# Patient Record
Sex: Female | Born: 1945 | Race: Black or African American | Hispanic: No | Marital: Married | Smoking: Current every day smoker
Health system: Southern US, Community
[De-identification: ages and names within clinical notes are randomized; demographics above are authoritative.]

## PROBLEM LIST (undated history)

## (undated) DIAGNOSIS — I10 Essential (primary) hypertension: Secondary | ICD-10-CM

## (undated) DIAGNOSIS — K219 Gastro-esophageal reflux disease without esophagitis: Secondary | ICD-10-CM

## (undated) DIAGNOSIS — J45909 Unspecified asthma, uncomplicated: Secondary | ICD-10-CM

## (undated) DIAGNOSIS — E7800 Pure hypercholesterolemia, unspecified: Secondary | ICD-10-CM

## (undated) DIAGNOSIS — E876 Hypokalemia: Secondary | ICD-10-CM

## (undated) DIAGNOSIS — M1990 Unspecified osteoarthritis, unspecified site: Secondary | ICD-10-CM

## (undated) HISTORY — PX: OTHER SURGICAL HISTORY: SHX169

## (undated) HISTORY — PX: NO PAST SURGERIES: SHX2092

---
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
## 2017-12-30 ENCOUNTER — Emergency Department (HOSPITAL_COMMUNITY)
Admission: EM | Admit: 2017-12-30 | Discharge: 2017-12-30 | Disposition: A | Discharge status: Home / Self Care | Payer: Medicare Other | Attending: Emergency Medicine | Admitting: Emergency Medicine

## 2017-12-30 ENCOUNTER — Other Ambulatory Visit: Payer: Self-pay

## 2017-12-30 ENCOUNTER — Encounter (HOSPITAL_COMMUNITY): Payer: Self-pay | Admitting: Emergency Medicine

## 2017-12-30 DIAGNOSIS — R030 Elevated blood-pressure reading, without diagnosis of hypertension: Secondary | ICD-10-CM | POA: Insufficient documentation

## 2017-12-30 DIAGNOSIS — I159 Secondary hypertension, unspecified: Secondary | ICD-10-CM

## 2017-12-30 MED ORDER — ATORVASTATIN CALCIUM 10 MG PO TABS
10.0000 mg | ORAL_TABLET | Freq: Every day | ORAL | Status: DC
  Filled 2017-12-30: qty 1

## 2017-12-30 MED ORDER — LISINOPRIL 10 MG PO TABS
10.0000 mg | ORAL_TABLET | Freq: Once | ORAL | Status: AC
  Administered 2017-12-30: 10 mg via ORAL
  Filled 2017-12-30: qty 1

## 2017-12-30 MED ORDER — ATORVASTATIN CALCIUM 10 MG PO TABS: 10.0000 mg | ORAL_TABLET | Freq: Every day | ORAL | 0 refills | Status: DC

## 2017-12-30 MED ORDER — LISINOPRIL 10 MG PO TABS: 10.0000 mg | ORAL_TABLET | Freq: Every day | ORAL | 0 refills | Status: DC

## 2017-12-30 NOTE — Progress Notes (Signed)
CSW consulted by Chaplain. CSW was informed that pt's husband is in need of food and money at this time. CSW informed husband and chaplain that CSW is unable to provide money to pt's husband for anything at this time. CSW provided pt's husband with food voucher to get dinner as CSW was made aware that pt may be here for a little while. At this time there are no further CSW needs. CSW signing off.    Claude MangesKierra S. Djuana Littleton, MSW, LCSW-A Emergency Department Clinical Social Worker [phone]

## 2017-12-30 NOTE — ED Provider Notes (Signed)
[facility] Provider Note   CSN: [id number] Arrival date & time: 12/30/17  1158     History   Chief Complaint Chief Complaint  Patient presents with  . Hypertension    HPI Stacey Harper is a 72 y.o. female.   Hypertension  This is a chronic problem. The current episode started more than 1 week ago. The problem occurs constantly. The problem has not changed since onset.Pertinent negatives include no chest pain, no abdominal pain, no headaches and no shortness of breath. Nothing aggravates the symptoms. Nothing relieves the symptoms. She has tried nothing (supposed to be on lisinopril) for the symptoms.    No past medical history on file.  There are no active problems to display for this patient.   Home Medications    Prior to Admission medications   Medication Sig Start Date End Date Taking? Authorizing Provider  atorvastatin (LIPITOR) 10 MG tablet Take 1 tablet (10 mg total) by mouth daily. 12/30/17   Monica Zahler, Barbara CowerJason, MD  lisinopril (PRINIVIL,ZESTRIL) 10 MG tablet Take 1 tablet (10 mg total) by mouth daily. 12/30/17   Dacota Ruben, Barbara CowerJason, MD    Family History No family history on file.  Social History Social History   Tobacco Use  . Smoking status: Not on file  Substance Use Topics  . Alcohol use: Not on file  . Drug use: Not on file     Allergies   Patient has no allergy information on record.   Review of Systems Review of Systems  Constitutional:       Generalized 'weird feeling'  Respiratory: Negative for shortness of breath.   Cardiovascular: Negative for chest pain.  Gastrointestinal: Negative for abdominal pain.  Neurological: Negative for headaches.  All other systems reviewed and are negative.    Physical Exam Updated Vital Signs BP (!) 178/76   Pulse (!) 43   Temp 98.7 F (37.1 C) (Oral)   Resp 14   Ht 5\' 6"  (1.676 m)   Wt 83.9 kg (185 lb)   SpO2 100%   BMI 29.86 kg/m   Physical Exam  Constitutional:  She is oriented to person, place, and time. She appears well-developed and well-nourished.  HENT:  Head: Normocephalic and atraumatic.  Eyes: Conjunctivae and EOM are normal.  No papiledema or retinal hemorrhage  Neck: Normal range of motion.  Cardiovascular: Normal rate and regular rhythm.  Pulmonary/Chest: No stridor. No respiratory distress.  Abdominal: Soft. She exhibits no distension.  Neurological: She is alert and oriented to person, place, and time. No cranial nerve deficit. Coordination normal.  Skin: Skin is warm and dry. No erythema. No pallor.  Nursing note and vitals reviewed.    ED Treatments / Results  Labs (all labs ordered are listed, but only abnormal results are displayed) Labs Reviewed - No data to display  EKG  EKG Interpretation None       Radiology No results found.  Procedures Procedures (including critical care time)  Medications Ordered in ED Medications  atorvastatin (LIPITOR) tablet 10 mg (not administered)  lisinopril (PRINIVIL,ZESTRIL) tablet 10 mg (10 mg Oral Given 12/30/17 1352)     Initial Impression / Assessment and Plan / ED Course  I have reviewed the triage vital signs and the nursing notes.  Pertinent labs & imaging results that were available during my care of the patient were reviewed by me and considered in my medical decision making (see chart for details).     This is a 72 yo M  who presents with asymptomatic hypertension. They have had blood pressures as high as 220. Not had any headache, chest pain, shortness of breath, abdominal pain, back pain lower extremity edema or changes in urinary frequency beyond normal. Has not been taking blood pressure medications as directed by the primary doctor. Exam does not show any evidence of neurologic changes, fluid overload, vision changes or other acute issues secondary to hypertension. As ACEP guidelines recommend not acutely lowering BP and having close PCP follow-up for asymptomatic  hypertension I suggested that they keep a log of their blood pressures for the next 5-7 days and follow-up with her primary doctor for any medication changes after restarting Lisinopril. They know to return to the emergency department for any onset of the symptoms as listed above.   Final Clinical Impressions(s) / ED Diagnoses   Final diagnoses:  Secondary hypertension    ED Discharge Orders        Ordered    atorvastatin (LIPITOR) 10 MG tablet  Daily     12/30/17 1425    lisinopril (PRINIVIL,ZESTRIL) 10 MG tablet  Daily     12/30/17 1425       Zayaan Kozak, Barbara Cower, MD 12/30/17 1604

## 2017-12-30 NOTE — Discharge Planning (Signed)
Lamees Gable J. Lucretia RoersWood, RN, BSN, [place] [phone]  [facility] Messingoger [facility] @10 :30.  Spoke with pt at bedside and advised to please arrive 15 min early and take a picture ID and your current medications.  Pt verbalizes understanding of keeping appointment.

## 2017-12-30 NOTE — ED Triage Notes (Signed)
Patient states she has been having headaches x several days and dizziness as well. 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

## 2017-12-30 NOTE — ED Notes (Signed)
When rounding on patient a gentleman in the room stating he was the patients husband abruptly stated he would like to talk with the chaplain. Chaplain called

## 2017-12-30 NOTE — ED Notes (Signed)
Chaplain paged for family per their request

## 2018-01-13 ENCOUNTER — Inpatient Hospital Stay (INDEPENDENT_AMBULATORY_CARE_PROVIDER_SITE_OTHER): Payer: Self-pay | Admitting: Physician Assistant

## 2018-01-24 ENCOUNTER — Ambulatory Visit (INDEPENDENT_AMBULATORY_CARE_PROVIDER_SITE_OTHER): Payer: Medicare Other | Admitting: Physician Assistant

## 2018-01-24 ENCOUNTER — Ambulatory Visit (HOSPITAL_COMMUNITY)
Admission: RE | Admit: 2018-01-24 | Discharge: 2018-01-24 | Disposition: A | Discharge status: Home / Self Care | Payer: MEDICARE | Source: Ambulatory Visit | Attending: Physician Assistant | Admitting: Physician Assistant

## 2018-01-24 ENCOUNTER — Encounter (INDEPENDENT_AMBULATORY_CARE_PROVIDER_SITE_OTHER): Payer: Self-pay | Admitting: Physician Assistant

## 2018-01-24 VITALS — BP 206/76 | HR 49 | Temp 97.3°F | Resp 18 | Ht 63.0 in | Wt 182.0 lb

## 2018-01-24 DIAGNOSIS — R0989 Other specified symptoms and signs involving the circulatory and respiratory systems: Secondary | ICD-10-CM

## 2018-01-24 DIAGNOSIS — R001 Bradycardia, unspecified: Secondary | ICD-10-CM | POA: Diagnosis not present

## 2018-01-24 DIAGNOSIS — I10 Essential (primary) hypertension: Secondary | ICD-10-CM | POA: Diagnosis not present

## 2018-01-24 LAB — EKG 12-LEAD

## 2018-01-24 MED ORDER — CHLORTHALIDONE 25 MG PO TABS: 25.0000 mg | ORAL_TABLET | Freq: Every day | ORAL | 5 refills | Status: DC

## 2018-01-24 MED ORDER — AMLODIPINE BESYLATE 5 MG PO TABS: 5.0000 mg | ORAL_TABLET | Freq: Every day | ORAL | 5 refills | Status: DC

## 2018-01-24 MED ORDER — LISINOPRIL 20 MG PO TABS: 20.0000 mg | ORAL_TABLET | Freq: Every day | ORAL | 5 refills | Status: DC

## 2018-01-24 NOTE — Progress Notes (Addendum)
Subjective:  Patient ID: Stacey SalisburyReta Harper, female    DOB: [birth year]  Age: 72 y.o. MRN: [id number]  CC: HTN   HPI Stacey Harper is a 72 y.o. female with a medical history of HTN and tobacco use presents as a new patient for managament of her HTN. Went to ED on 12/30/17 for asymptomatic HTN and diagnosed with secondary HTN. BP 178/76 mmHg HR 43. Pt reported systolic BP usually in the 220s. No labs, imaging, ECG, or consult made at the ED. Patient maintains that she is asymptomatic. Takes lisinopril 10 mg daily as directed. BP still noted to be elevated in clinic today. Does not endorse CP, palpitations, SOB, HA, PND, orthopnea, presyncope, syncope, claudication, abdominal pain, f/c/n/v, rash, GI/GU sxs.        Outpatient Medications Prior to Visit  Medication Sig Dispense Refill  . atorvastatin (LIPITOR) 10 MG tablet Take 1 tablet (10 mg total) by mouth daily. 30 tablet 0  . lisinopril (PRINIVIL,ZESTRIL) 10 MG tablet Take 1 tablet (10 mg total) by mouth daily. 30 tablet 0   No facility-administered medications prior to visit.      ROS Review of Systems  Constitutional: Negative for chills, fever and malaise/fatigue.  Eyes: Negative for blurred vision.  Respiratory: Negative for shortness of breath.   Cardiovascular: Negative for chest pain and palpitations.  Gastrointestinal: Negative for abdominal pain and nausea.  Genitourinary: Negative for dysuria and hematuria.  Musculoskeletal: Negative for joint pain and myalgias.  Skin: Negative for rash.  Neurological: Negative for tingling and headaches.  Psychiatric/Behavioral: Negative for depression. The patient is not nervous/anxious.     Objective:   Vitals:   01/24/18 1023 01/24/18 1053  BP: (!) 204/67 (!) 206/76  Pulse: (!) 49   Resp: 18   Temp: (!) 97.3 F (36.3 C)   SpO2: 100%       Physical Exam  Constitutional: She is oriented to person, place, and time.  Well developed, well nourished, NAD, polite  HENT:  Head:  Normocephalic and atraumatic.  Eyes: No scleral icterus.  Neck: Normal range of motion. Neck supple. No thyromegaly present.  Cardiovascular: Normal rate, regular rhythm and normal heart sounds.  No carotid bruits.  Pulmonary/Chest: Effort normal and breath sounds normal.  Abdominal: Soft. Bowel sounds are normal. There is no tenderness.  Musculoskeletal: She exhibits no edema.  Lymphadenopathy:    She has no cervical adenopathy.  Neurological: She is alert and oriented to person, place, and time. No cranial nerve deficit. Coordination normal.  Skin: Skin is warm and dry. No rash noted. No erythema. No pallor.  Psychiatric: She has a normal mood and affect. Her behavior is normal. Thought content normal.  Vitals reviewed.    Assessment & Plan:    1. Hypertension, isolated systolic - EKG 12-Lead, sent to [facility] for EKG. Precordial leads not recording on our EKG.  - DG Chest 2 View; Future - Comprehensive metabolic panel - CBC with Differential - TSH - Brain natriuretic peptide - Lipid panel - Begin Chlorthalidone 25 mg one tablet po qam x30 days #30 five refills - Increase lisinopril (PRINIVIL,ZESTRIL) 20 MG tablet; Take 1 tablet (20 mg total) by mouth daily.  Dispense: 30 tablet; Refill: 5 - Advised patient to return as a walk in next Friday to have BP checked.  - Ambulatory referral to Cardiology  2. Widened pulse pressure  3. Bradycardia - possible thyroid etiology? Temperature is also somewhat low that may further support thyroid disease. Will wait for result of  TSH.  - TSH   Follow-up: 1 week for BP check  Loletta Specter PA

## 2018-01-24 NOTE — Addendum Note (Signed)
Addended by: Sindy MessingGOMEZ, ROGER D on: 01/24/2018 03:32 PM   Modules accepted: Orders

## 2018-01-25 LAB — SPECIMEN STATUS REPORT

## 2018-01-27 ENCOUNTER — Other Ambulatory Visit (INDEPENDENT_AMBULATORY_CARE_PROVIDER_SITE_OTHER): Payer: Self-pay | Admitting: *Deleted

## 2018-01-27 ENCOUNTER — Telehealth (INDEPENDENT_AMBULATORY_CARE_PROVIDER_SITE_OTHER): Payer: Self-pay | Admitting: *Deleted

## 2018-01-27 ENCOUNTER — Other Ambulatory Visit (INDEPENDENT_AMBULATORY_CARE_PROVIDER_SITE_OTHER): Payer: Self-pay | Admitting: Physician Assistant

## 2018-01-27 DIAGNOSIS — I10 Essential (primary) hypertension: Secondary | ICD-10-CM

## 2018-01-27 DIAGNOSIS — E7800 Pure hypercholesterolemia, unspecified: Secondary | ICD-10-CM

## 2018-01-27 MED ORDER — LISINOPRIL 20 MG PO TABS: 20.0000 mg | ORAL_TABLET | Freq: Every day | ORAL | 5 refills | Status: DC

## 2018-01-27 MED ORDER — ATORVASTATIN CALCIUM 20 MG PO TABS: 20.0000 mg | ORAL_TABLET | Freq: Every day | ORAL | 3 refills | Status: DC

## 2018-01-27 MED ORDER — CHLORTHALIDONE 25 MG PO TABS: 25.0000 mg | ORAL_TABLET | Freq: Every day | ORAL | 5 refills | Status: DC

## 2018-01-27 NOTE — Telephone Encounter (Signed)
Medical Assistant left message on patient's home and cell voicemail. Voicemail states to give a call back [place] [facility] at [phone]. Please inform patient of chest x ray being normal and cholesterol being a bit elevated so an increase was sent to the pharmacy. Please ask patient to return to clinic for ONLY a blood draw. Whenever she is able to drop in ASAP.

## 2018-01-27 NOTE — Telephone Encounter (Signed)
Patient verified DOB Patient is aware of chest xray being normal and atorvastatin being increased due to elevated cholesterol levels. Patient will report back today to have blood drawn for CBC and BNP. Patient requested medication be sent to [facility] on [address] so MA reordered medications there.

## 2018-01-27 NOTE — Telephone Encounter (Signed)
-----   Message from Loletta Specteroger David Gomez, PA-C sent at 01/27/2018  8:46 AM EST ----- CBC and BNP not performed needs redo. Please separate and freeze the BNP. Redraw CBC on a separate lavender.  Thyroid normal, cholesterol is mildly elevated I will increase her atorvastatin from 10 mg to 20 mg qhs. [facility]

## 2018-01-30 LAB — COMPREHENSIVE METABOLIC PANEL
A/G RATIO: 1.1 — AB (ref 1.2–2.2)
ALT: 14 IU/L (ref 0–32)
AST: 15 IU/L (ref 0–40)
Albumin: 4 g/dL (ref 3.6–4.8)
Alkaline Phosphatase: 97 IU/L (ref 39–117)
BUN / CREAT RATIO: 11 — AB (ref 12–28)
BUN: 11 mg/dL (ref 8–27)
Bilirubin Total: 0.5 mg/dL (ref 0.0–1.2)
CALCIUM: 9.4 mg/dL (ref 8.7–10.3)
CO2: 24 mmol/L (ref 20–29)
Chloride: 102 mmol/L (ref 96–106)
Creatinine, Ser: 0.99 mg/dL (ref 0.57–1.00)
GFR, EST AFRICAN AMERICAN: 67 mL/min/{1.73_m2} (ref >59)
GFR, EST NON AFRICAN AMERICAN: 58 mL/min/{1.73_m2} — AB (ref >59)
GLOBULIN, TOTAL: 3.5 g/dL (ref 1.5–4.5)
Glucose: 83 mg/dL (ref 65–99)
Potassium: 3.8 mmol/L (ref 3.5–5.2)
SODIUM: 141 mmol/L (ref 134–144)
Total Protein: 7.5 g/dL (ref 6.0–8.5)

## 2018-01-30 LAB — LIPID PANEL
CHOL/HDL RATIO: 4 ratio (ref 0.0–4.4)
Cholesterol, Total: 171 mg/dL (ref 100–199)
HDL: 43 mg/dL (ref >39)
LDL Calculated: 110 mg/dL — ABNORMAL HIGH (ref 0–99)
Triglycerides: 91 mg/dL (ref 0–149)
VLDL CHOLESTEROL CAL: 18 mg/dL (ref 5–40)

## 2018-01-30 LAB — CBC WITH DIFFERENTIAL/PLATELET

## 2018-01-30 LAB — SPECIMEN STATUS REPORT

## 2018-01-30 LAB — BRAIN NATRIURETIC PEPTIDE: BNP: 139.2 pg/mL — AB (ref 0.0–100.0)

## 2018-01-30 LAB — TSH: TSH: 2.1 u[IU]/mL (ref 0.450–4.500)

## 2018-02-03 ENCOUNTER — Ambulatory Visit: Payer: Self-pay | Admitting: Cardiology

## 2018-02-05 ENCOUNTER — Encounter: Payer: Self-pay | Admitting: Cardiology

## 2018-02-07 ENCOUNTER — Telehealth (INDEPENDENT_AMBULATORY_CARE_PROVIDER_SITE_OTHER): Payer: Self-pay | Admitting: *Deleted

## 2018-02-07 NOTE — Telephone Encounter (Signed)
-----   Message from Loletta Specteroger David Gomez, PA-C sent at 02/05/2018 12:51 PM EST ----- Result not at the level of CHF.

## 2018-02-07 NOTE — Telephone Encounter (Signed)
Patient verified DOB Patient is aware of BNP level not being at CHF.

## 2018-03-06 ENCOUNTER — Ambulatory Visit (INDEPENDENT_AMBULATORY_CARE_PROVIDER_SITE_OTHER): Payer: Self-pay | Admitting: Physician Assistant

## 2018-03-17 ENCOUNTER — Telehealth (INDEPENDENT_AMBULATORY_CARE_PROVIDER_SITE_OTHER): Payer: Self-pay | Admitting: Physician Assistant

## 2018-03-17 NOTE — Telephone Encounter (Signed)
Pt is  Feeling Dizzy and feel pass out  All day she thinks is the medication  Lisinopril 20mg  . Please, call her  Thank You

## 2018-03-17 NOTE — Telephone Encounter (Signed)
Patient prescribed this medication back in February. She was also asked to come in for a BP check but did not do so. I advise she return for a HTN follow up. May continue on Chlorthalidone and suspend Lisinopril if she thinks Lisinopril is causing her symptoms.

## 2018-03-18 NOTE — Telephone Encounter (Signed)
Patient has scheduled an appointment for 4/30. She is aware that she may continue on chlorthalidone and suspend lisinopril. Stacey Harper, CMA

## 2018-04-08 ENCOUNTER — Encounter (INDEPENDENT_AMBULATORY_CARE_PROVIDER_SITE_OTHER): Payer: Self-pay | Admitting: Physician Assistant

## 2018-04-08 ENCOUNTER — Ambulatory Visit (INDEPENDENT_AMBULATORY_CARE_PROVIDER_SITE_OTHER): Payer: Medicare Other | Admitting: Physician Assistant

## 2018-04-08 ENCOUNTER — Other Ambulatory Visit: Payer: Self-pay

## 2018-04-08 VITALS — BP 102/67 | HR 48 | Temp 97.5°F | Ht 63.0 in | Wt 172.4 lb

## 2018-04-08 DIAGNOSIS — I10 Essential (primary) hypertension: Secondary | ICD-10-CM

## 2018-04-08 DIAGNOSIS — R42 Dizziness and giddiness: Secondary | ICD-10-CM

## 2018-04-08 DIAGNOSIS — F4323 Adjustment disorder with mixed anxiety and depressed mood: Secondary | ICD-10-CM

## 2018-04-08 MED ORDER — LISINOPRIL 5 MG PO TABS: 5.0000 mg | ORAL_TABLET | Freq: Every day | ORAL | 3 refills | Status: DC

## 2018-04-08 NOTE — Progress Notes (Signed)
Subjective:  Patient ID: Stacey Harper, female    DOB: [birth year]  Age: 72 y.o. MRN: [id number]  CC: f/u HTN. Dizziness.  HPI Stacey Harper is a 72 y.o. female with a medical history of HTN and tobacco use presents on f/u of HTN. Last BP 206/76 mmHg two and a half months ago. BP 102/67 today. Says she has been feeling lightheaded. Had an episode where she was out in hot weather and felt pre-syncopal. Had some chest tightness during the pre-syncopal episode. Lasted momentarily and recovered after eating an orange. Says she had stopped Chlorthalidone 25 mg "for a while" but she began taking again around the time of her pre-syncope. PHQ9 18 and GAD7 16. Attributed to going through eviction process. Does not have any help and is very anxious. Does not endorse suicidal ideation or intent. Currently without CP, palpitations, SOB, HA, tingling, numbness, abdominal pain, f/c/n/v, rash, or GI/GU sxs.      Outpatient Medications Prior to Visit  Medication Sig Dispense Refill  . atorvastatin (LIPITOR) 20 MG tablet Take 1 tablet (20 mg total) by mouth daily. 90 tablet 3  . chlorthalidone (HYGROTON) 25 MG tablet Take 1 tablet (25 mg total) by mouth daily. 30 tablet 5  . lisinopril (PRINIVIL,ZESTRIL) 20 MG tablet Take 1 tablet (20 mg total) by mouth daily. 30 tablet 5  . amLODipine (NORVASC) 5 MG tablet Take 5 mg by mouth every morning.  5   No facility-administered medications prior to visit.      ROS Review of Systems  Constitutional: Negative for chills, fever and malaise/fatigue.  Eyes: Negative for blurred vision.  Respiratory: Negative for shortness of breath.   Cardiovascular: Negative for chest pain and palpitations.  Gastrointestinal: Negative for abdominal pain and nausea.  Genitourinary: Negative for dysuria and hematuria.  Musculoskeletal: Negative for joint pain and myalgias.  Skin: Negative for rash.  Neurological: Negative for tingling and headaches.  Psychiatric/Behavioral: Negative  for depression. The patient is not nervous/anxious.     Objective:  BP 102/67 (BP Location: Right Arm, Patient Position: Sitting, Cuff Size: Normal)   Pulse (!) 48   Temp (!) 97.5 F (36.4 C) (Oral)   Ht  (1.6 m)   Wt 172 lb 6.4 oz (78.2 kg)   SpO2 100%   BMI 30.54 kg/m   BP/Weight 04/08/2018 01/24/2018 12/30/2017  Systolic BP 102 206 178  Diastolic BP 67 76 76  Wt. (Lbs) 172.4 182 185  BMI 30.54 32.24 29.86      Physical Exam  Constitutional: She is oriented to person, place, and time.  Well developed, well nourished, NAD, polite  HENT:  Head: Normocephalic and atraumatic.  Eyes: No scleral icterus.  Neck: Normal range of motion. Neck supple. No thyromegaly present.  Cardiovascular: Normal rate, regular rhythm and intact distal pulses.  1/6 systolic murmur  Pulmonary/Chest: Effort normal and breath sounds normal.  Abdominal: Soft. Bowel sounds are normal. There is no tenderness.  Musculoskeletal: She exhibits no edema.  Neurological: She is alert and oriented to person, place, and time.  Skin: Skin is warm and dry. No rash noted. No erythema. No pallor.  Psychiatric: She has a normal mood and affect. Her behavior is normal. Thought content normal.  Vitals reviewed.    Assessment & Plan:     1. Essential hypertension - Decrease lisinopril (PRINIVIL,ZESTRIL) 5 MG tablet; Take 1 tablet (5 mg total) by mouth daily.  Dispense: 90 tablet; Refill: 3 - Continue on chlorthalidone 25 mg - Basic Metabolic  Panel  2. Lightheadedness - Decrease lisinopril (PRINIVIL,ZESTRIL) 5 MG tablet; Take 1 tablet (5 mg total) by mouth daily.  Dispense: 90 tablet; Refill: 3 - Continue on chlorthalidone 25 mg  - Basic Metabolic Panel  3. Adjustment disorder with mixed anxiety and depressed mood - I have messaged Jasmine Lewis LCSW to reach out to patient.   Meds ordered this encounter  Medications  . lisinopril (PRINIVIL,ZESTRIL) 5 MG tablet    Sig: Take 1 tablet (5 mg total) by  mouth daily.    Dispense:  90 tablet    Refill:  3    Order Specific Question:   Supervising Provider    Answer:   Quentin Angst [id number]    Follow-up: Return in about 1 month (around 05/06/2018) for Medication adjustment f/u.Marland Kitchen   Loletta Specter PA

## 2018-04-08 NOTE — Patient Instructions (Addendum)
Community Resources  Advocacy/Legal Legal Aid [place]:  1-866-219-5262  /  336-272-0148  Family Justice Center:  336-641-7233  [facility] 24-hr Crisis line:  336-273-7273  Women's Resource Center, [place]:  336-275-6090  Court Watch (custody):  336-275-2346  [place] [facility]:   336-279-9299    Baby & Breastfeeding Car Seat Inspection @ Various [place] Fire Depts.- call 336-373-2177  [place] [facility]  336-832-6860  [facility] 336-878-6712  WIC: 336-641-3663 ([place]);  336-641-7571 ([place])  La Leche League:  1-877-452-5321   Childcare [place] [facility]: 336-369-5097 ([place]) / 336-887-8224 ([place])  - Child Care Resources/ Referrals/ Scholarships  - Head Start/ Early Head Start (call or apply online)  [place] DHHS: [place] Pre-K :  1-800-859-0829 / 336-274-5437   Employment / Job Search [facility] [place]: 336-275-6090 / [address]  [employer] ([employer]): 336-373-5922 ([place]) / 336-882-4141 ([place])  [facility]/ Career Center: 336-275-9801 / 336-282-7307  [facility] Career Center: 336-373-3764  DHHS Work First: 336-641-3447 ([place]) / 336-641-3447 ([place])  [facility] [place]:  336-676-5871   Financial Assistance [facility]:  336-553-2657  [employer]: 336-235-0368  [employer] (furniture):  336-370-4002  [place] [facility]: 336-373-4264  Low Income Energy Assistance  336-641-3000   Food Assistance DHHS- SNAP/ Food Stamps: 336-641-4588  WIC: [place]- 336-641-3663 ;  [place] 336-641-7571  Little Green Book- Free Meals  Little Blue Book- Free Food Pantries  During the summer, text "FOOD" to 877877   General Health / Clinics (Adults) Orange Card (for Adults) through [facility]: (336) 895-4900  [facility]:   336-832-8035  [facility]:   336-832-4444  Health Department:  336-641-3245  [facility]:  336-415-3877 / 336-641-2100  [facility] [place]:   336-373-0678  [facility]:   336-334-4822 x 50251   Housing [place] [facility]:   336-691-9521  [place] [facility]:  336-275-8501  Affordable Housing Managemnt:  336-273-0568   Immigrant/ Refugee Center for [place]ians ([employer]):  336-256-1065  [facility]:  336-379-0037  [employer] [facility]:  336-937-4701  [employer]:  336-617-0381  African Services Coalition:  336-574-2677   LGBTQ YouthSAFE  www.youthsafe[place].org  PFLAG  336-541-6754 / info@[place].org  The Trevor Project:  1-866-488-7386   Mental Health/ Substance Use [facility]  336-387-6161  [facility]:  336-832-9700 or 1-800-711-2635  [facility] of Care:  336-271-5888  [facility]:  336-294-1349  [facility]:  336-542-2884  [facility] (walk-ins)  336-676-6840 / [address]  [place]:  800-449-1287  Alcoholics Anonymous:  336-854-4278  Narcotics Anonymous:  800-365-1036  Quit Smoking Hotline:  800-QUIT-NOW (800-784-8669)   Parenting Children's Home Society:  800-632-1400  [place]: Education Center & Support Groups:  336-832-6682  YWCA: 336-273-3461  [employer]: Bringing Out the Best:  336-334-3120               Thriving at Three (Hispanic families): 336-256-1066  Healthy Start ([facility]):  336-387-6161 x2288  Parents as Teachers:  336-691-0024  [place] [facility]- Learning Together (Immigrants): 336-369-5001   Poison Control 800-222-1222  Sports & Recreation [employer] Open Doors Application: ymcanwnc.org/join/open-doors-financial-assistance/  City of [place] Recreation Centers: http://www.[place].gov/index.aspx?page=3615   Special Needs Family Support Network:  336-832-6507  Autism Society of :   336-333-0197 x1402 or x1412 /  800-785-1035  [facility]CH [place]:  336-334-5773     ARC of [place]:  336-373-1076  Children's Developmental Service Agency (CDSA):  336-334-5601  CC4C (Care Coordination for Children):  336-641-7641   Transportation Medicaid Transportation: 336-641-4848 to apply  [place] Transit Authority: 336-335-6499 (reduced-fare bus ID to Medicaid/ Medicare/ Orange Card)  SCAT Paratransit services: Eligible riders only, call 336-333-6589 for application   Tutoring/Mentoring [facility] Child Development Institute: 336-230-2138  Big Brothers/ Big Sisters: 336-378-9100 ([place])  336-882-4167 ([place])  ACES through child's school: 336-370-2321  [employer] Achievers: contact your local Y  SHIELD Mentor Program: 336-337-2771    Hypertension Hypertension, commonly called high blood pressure, is when the force of blood pumping through the arteries is too strong. The arteries are the blood vessels that carry blood from the heart throughout the body. Hypertension forces the heart to work harder to pump blood and may cause arteries to become narrow or stiff. Having untreated or uncontrolled hypertension can cause heart attacks, strokes, kidney disease, and other problems. A blood pressure reading consists of a higher number over a lower number. Ideally, your blood pressure should be below 120/80. The first ("top") number is called the systolic pressure. It is a measure of the pressure in your arteries as your heart beats. The second ("bottom") number is called the diastolic pressure. It is a measure of the pressure in your arteries as the heart relaxes. What are the causes? The cause of this condition is not known. What increases the risk? Some risk factors for high blood pressure are under your control. Others are not. Factors you can change  Smoking.  Having type 2 diabetes mellitus, high cholesterol, or both.  Not getting enough exercise or physical activity.  Being overweight.  Having too much fat, sugar, calories, or salt (sodium) in your diet.  Drinking too  much alcohol. Factors that are difficult or impossible to change  Having chronic kidney disease.  Having a family history of high blood pressure.  Age. Risk increases with age.  Race. You may be at higher risk if you are African-American.  Gender. Men are at higher risk than women before age 45. After age 65, women are at higher risk than men.  Having obstructive sleep apnea.  Stress. What are the signs or symptoms? Extremely high blood pressure (hypertensive crisis) may cause:  Headache.  Anxiety.  Shortness of breath.  Nosebleed.  Nausea and vomiting.  Severe chest pain.  Jerky movements you cannot control (seizures).  How is this diagnosed? This condition is diagnosed by measuring your blood pressure while you are seated, with your arm resting on a surface. The cuff of the blood pressure monitor will be placed directly against the skin of your upper arm at the level of your heart. It should be measured at least twice using the same arm. Certain conditions can cause a difference in blood pressure between your right and left arms. Certain factors can cause blood pressure readings to be lower or higher than normal (elevated) for a short period of time:  When your blood pressure is higher when you are in a health care provider's office than when you are at home, this is called white coat hypertension. Most people with this condition do not need medicines.  When your blood pressure is higher at home than when you are in a health care provider's office, this is called masked hypertension. Most people with this condition may need medicines to control blood pressure.  If you have a high blood pressure reading during one visit or you have normal blood pressure with   other risk factors:  You may be asked to return on a different day to have your blood pressure checked again.  You may be asked to monitor your blood pressure at home for 1 week or longer.  If you are diagnosed  with hypertension, you may have other blood or imaging tests to help your health care provider understand your overall risk for other conditions. How is this treated? This condition is treated by making healthy lifestyle changes, such as eating healthy foods, exercising more, and reducing your alcohol intake. Your health care provider may prescribe medicine if lifestyle changes are not enough to get your blood pressure under control, and if:  Your systolic blood pressure is above 130.  Your diastolic blood pressure is above 80.  Your personal target blood pressure may vary depending on your medical conditions, your age, and other factors. Follow these instructions at home: Eating and drinking  Eat a diet that is high in fiber and potassium, and low in sodium, added sugar, and fat. An example eating plan is called the DASH (Dietary Approaches to Stop Hypertension) diet. To eat this way: ? Eat plenty of fresh fruits and vegetables. Try to fill half of your plate at each meal with fruits and vegetables. ? Eat whole grains, such as whole wheat pasta, brown rice, or whole grain bread. Fill about one quarter of your plate with whole grains. ? Eat or drink low-fat dairy products, such as skim milk or low-fat yogurt. ? Avoid fatty cuts of meat, processed or cured meats, and poultry with skin. Fill about one quarter of your plate with lean proteins, such as fish, chicken without skin, beans, eggs, and tofu. ? Avoid premade and processed foods. These tend to be higher in sodium, added sugar, and fat.  Reduce your daily sodium intake. Most people with hypertension should eat less than 1,500 mg of sodium a day.  Limit alcohol intake to no more than 1 drink a day for nonpregnant women and 2 drinks a day for men. One drink equals 12 oz of beer, 5 oz of wine, or 1 oz of hard liquor. Lifestyle  Work with your health care provider to maintain a healthy body weight or to lose weight. Ask what an ideal weight  is for you.  Get at least 30 minutes of exercise that causes your heart to beat faster (aerobic exercise) most days of the week. Activities may include walking, swimming, or biking.  Include exercise to strengthen your muscles (resistance exercise), such as pilates or lifting weights, as part of your weekly exercise routine. Try to do these types of exercises for 30 minutes at least 3 days a week.  Do not use any products that contain nicotine or tobacco, such as cigarettes and e-cigarettes. If you need help quitting, ask your health care provider.  Monitor your blood pressure at home as told by your health care provider.  Keep all follow-up visits as told by your health care provider. This is important. Medicines  Take over-the-counter and prescription medicines only as told by your health care provider. Follow directions carefully. Blood pressure medicines must be taken as prescribed.  Do not skip doses of blood pressure medicine. Doing this puts you at risk for problems and can make the medicine less effective.  Ask your health care provider about side effects or reactions to medicines that you should watch for. Contact a health care provider if:  You think you are having a reaction to a medicine you are   taking.  You have headaches that keep coming back (recurring).  You feel dizzy.  You have swelling in your ankles.  You have trouble with your vision. Get help right away if:  You develop a severe headache or confusion.  You have unusual weakness or numbness.  You feel faint.  You have severe pain in your chest or abdomen.  You vomit repeatedly.  You have trouble breathing. Summary  Hypertension is when the force of blood pumping through your arteries is too strong. If this condition is not controlled, it may put you at risk for serious complications.  Your personal target blood pressure may vary depending on your medical conditions, your age, and other factors. For  most people, a normal blood pressure is less than 120/80.  Hypertension is treated with lifestyle changes, medicines, or a combination of both. Lifestyle changes include weight loss, eating a healthy, low-sodium diet, exercising more, and limiting alcohol. This information is not intended to replace advice given to you by your health care provider. Make sure you discuss any questions you have with your health care provider. Document Released: 11/26/2005 Document Revised: 10/24/2016 Document Reviewed: 10/24/2016 Elsevier Interactive Patient Education  2018 Elsevier Inc.  

## 2018-04-09 ENCOUNTER — Telehealth (INDEPENDENT_AMBULATORY_CARE_PROVIDER_SITE_OTHER): Payer: Self-pay

## 2018-04-09 LAB — BASIC METABOLIC PANEL
BUN / CREAT RATIO: 19 (ref 12–28)
BUN: 25 mg/dL (ref 8–27)
CO2: 27 mmol/L (ref 20–29)
CREATININE: 1.3 mg/dL — AB (ref 0.57–1.00)
Calcium: 9.7 mg/dL (ref 8.7–10.3)
Chloride: 97 mmol/L (ref 96–106)
GFR calc Af Amer: 48 mL/min/{1.73_m2} — ABNORMAL LOW (ref >59)
GFR, EST NON AFRICAN AMERICAN: 42 mL/min/{1.73_m2} — AB (ref >59)
Glucose: 102 mg/dL — ABNORMAL HIGH (ref 65–99)
Potassium: 3.8 mmol/L (ref 3.5–5.2)
SODIUM: 138 mmol/L (ref 134–144)

## 2018-04-09 NOTE — Telephone Encounter (Signed)
Patient is aware of mildly elevated creatinine possibly due to lisinopril use but since lisinopril has been discontinued we will redraw at future visit. Maryjean Morn, CMA

## 2018-04-09 NOTE — Telephone Encounter (Signed)
-----   Message from Loletta Specter, PA-C sent at 04/09/2018  8:32 AM EDT ----- Creatinine mildly elevated. This may have been due to lisinopril use but lisinopril has been discontinued. Will redraw at her future visit.

## 2018-04-11 ENCOUNTER — Telehealth: Payer: Self-pay | Admitting: Licensed Clinical Social Worker

## 2018-04-11 NOTE — Telephone Encounter (Signed)
LCSWA attempted to contact pt to follow up on behavioral health screens from previos appointment. LCSWA left message for a return call.

## 2018-04-16 ENCOUNTER — Telehealth: Payer: Self-pay | Admitting: Licensed Clinical Social Worker

## 2018-04-16 NOTE — Telephone Encounter (Signed)
LCSWA attempted to contact pt to follow up on housing resources and behavioral health screens from previous appointment. LCSWA left message for a return call.   

## 2018-04-22 ENCOUNTER — Telehealth: Payer: Self-pay | Admitting: Licensed Clinical Social Worker

## 2018-04-22 NOTE — Telephone Encounter (Signed)
Call placed to pt. LCSWA introduced self and explained role at [facility]. LCSWA disclosed that she received a consult from PA-C Lily Kocher to address housing resources.   Pt agreed to schedule appointment with LCSWA on 04/24/18 to address psychosocial and behavioral health needs. No additional concerns noted.

## 2018-04-24 ENCOUNTER — Institutional Professional Consult (permissible substitution): Payer: Medicaid Other | Admitting: Licensed Clinical Social Worker

## 2018-04-25 ENCOUNTER — Ambulatory Visit: Payer: MEDICARE | Attending: Physician Assistant | Admitting: Licensed Clinical Social Worker

## 2018-04-25 ENCOUNTER — Telehealth: Payer: Self-pay

## 2018-04-25 DIAGNOSIS — F4323 Adjustment disorder with mixed anxiety and depressed mood: Secondary | ICD-10-CM

## 2018-04-25 NOTE — BH Specialist Note (Signed)
Integrated Behavioral Health Initial Visit  MRN: [id number] Name: Stacey Harper  Number of Integrated Behavioral Health Clinician visits:: 1/6 Session Start time: 11:30AM  Session End time: 12:15 PM Total time: 45 minutes  Type of Service: Integrated Behavioral Health- Individual/Family Interpretor:No. Interpretor Name and Language: N/A   Warm Hand Off Completed.       SUBJECTIVE: Stacey Harper is a 72 y.o. female accompanied by self Patient was referred by Conley Simmonds for anxiety and depression. Patient reports the following symptoms/concerns: overwhelming feelings of sadness and worry, difficulty sleeping, low energy, decreased appetite, decreased concentration, irritability, and hx of suicidal ideations Duration of problem: Couple of months; Severity of problem: moderate  OBJECTIVE: Mood: Anxious and Depressed and Affect: Appropriate Risk of harm to self or others: No plan to harm self or others Pt denies current SI/HI/AVH. No plan or intent to harm self or others  LIFE CONTEXT: Family and Social: Pt resides with spouse. She receives support from adult son who resides in [place]. Pt has a brother who resides locally School/Work: Pt and spouse receives SSDI and Medicaid. They recently applied for food stamps Self-Care: Pt reports taking prescribed medication to manage physical health. She enjoys walking to cope with stressors Life Changes: Pt has ongoing medical conditions and spouse is currently hospitalized due to a decline in health.  She and spouse are currently being evicted and receive limited support  GOALS ADDRESSED: Patient will: 1. Reduce symptoms of: anxiety, depression and stress 2. Increase knowledge and/or ability of: coping skills and stress reduction  3. Demonstrate ability to: Increase healthy adjustment to current life circumstances and Increase adequate support systems for patient/family  INTERVENTIONS: Interventions utilized: Solution-Focused Strategies,  Supportive Counseling, Psychoeducation and/or Health Education and Link to [employer]  Standardized Assessments completed: Not Needed  ASSESSMENT: Patient currently experiencing depression and anxiety triggered by ongoing medical conditions, a decline in spouse's health, and current eviction. She reports overwhelming feelings of sadness and worry, difficulty sleeping, low energy, decreased appetite, decreased concentration, irritability, and hx of suicidal ideations. Pt denies current SI/HI/AVH. No plan or intent to harm self or others. Safety plan was discussed and crisis intervention resources were provided.    Patient may benefit from psychoeducation, psychotherapy, and medication management. LCSWA educated pt on the correlation between physical and mental health, in addition, to how stress can negatively impact health. Therapeutic interventions were discussed. Pt was successful in identifying healthy coping skills. LCSWA provided pt with community resources to assist in obtaining housing. A Legal Aid referral was completed to assist family with current eviction.  PLAN: 1. Follow up with behavioral health clinician on : Pt was encouraged to contact LCSWA if symptoms worsen or fail to improve to schedule behavioral appointments at [facility] 2. Behavioral recommendations: LCSWA recommends that pt apply healthy coping skills discussed and utilize provided resources. Pt is encouraged to schedule follow up appointment with LCSWA 3. Referral(s): Integrated Art gallery manager (In Clinic), Community Mental Health Services (LME/Outside Clinic) and Community Resources:  Housing and Legal Aid 4. "From scale of 1-10, how likely are you to follow plan?": 10/10  Bridgett Larsson, LCSW 04/28/2018 4:26 PM

## 2018-04-29 NOTE — Telephone Encounter (Signed)
Error

## 2018-06-13 ENCOUNTER — Encounter (HOSPITAL_COMMUNITY): Payer: Self-pay | Admitting: Emergency Medicine

## 2018-06-13 ENCOUNTER — Other Ambulatory Visit: Payer: Self-pay

## 2018-06-13 ENCOUNTER — Emergency Department (HOSPITAL_COMMUNITY)
Admission: EM | Admit: 2018-06-13 | Discharge: 2018-06-13 | Disposition: A | Discharge status: Home / Self Care | Payer: Medicare Other | Attending: Emergency Medicine | Admitting: Emergency Medicine

## 2018-06-13 DIAGNOSIS — I10 Essential (primary) hypertension: Secondary | ICD-10-CM | POA: Diagnosis not present

## 2018-06-13 DIAGNOSIS — M5489 Other dorsalgia: Secondary | ICD-10-CM | POA: Insufficient documentation

## 2018-06-13 DIAGNOSIS — F17210 Nicotine dependence, cigarettes, uncomplicated: Secondary | ICD-10-CM | POA: Diagnosis not present

## 2018-06-13 DIAGNOSIS — M62838 Other muscle spasm: Secondary | ICD-10-CM | POA: Insufficient documentation

## 2018-06-13 DIAGNOSIS — Z79899 Other long term (current) drug therapy: Secondary | ICD-10-CM | POA: Diagnosis not present

## 2018-06-13 DIAGNOSIS — M542 Cervicalgia: Secondary | ICD-10-CM | POA: Diagnosis present

## 2018-06-13 HISTORY — DX: Gastro-esophageal reflux disease without esophagitis: K21.9

## 2018-06-13 HISTORY — DX: Essential (primary) hypertension: I10

## 2018-06-13 HISTORY — DX: Pure hypercholesterolemia, unspecified: E78.00

## 2018-06-13 HISTORY — DX: Unspecified osteoarthritis, unspecified site: M19.90

## 2018-06-13 LAB — I-STAT TROPONIN, ED: Troponin i, poc: 0 ng/mL (ref 0.00–0.08)

## 2018-06-13 MED ORDER — ACETAMINOPHEN 325 MG PO TABS: 650.0000 mg | ORAL_TABLET | Freq: Four times a day (QID) | ORAL | 0 refills | Status: DC | PRN

## 2018-06-13 MED ORDER — METHOCARBAMOL 500 MG PO TABS: 500.0000 mg | ORAL_TABLET | Freq: Four times a day (QID) | ORAL | 0 refills | Status: DC

## 2018-06-13 MED ORDER — ACETAMINOPHEN 325 MG PO TABS
650.0000 mg | ORAL_TABLET | Freq: Once | ORAL | Status: AC
  Administered 2018-06-13: 650 mg via ORAL
  Filled 2018-06-13: qty 2

## 2018-06-13 MED ORDER — METHOCARBAMOL 500 MG PO TABS
500.0000 mg | ORAL_TABLET | Freq: Once | ORAL | Status: AC
  Administered 2018-06-13: 500 mg via ORAL
  Filled 2018-06-13: qty 1

## 2018-06-13 NOTE — ED Triage Notes (Signed)
Pt states she has chronic neck, shoulder, and upper back pain. Has been awake all night.  She is concerned it is her blood pressure and wanted to be evaluated

## 2018-06-13 NOTE — ED Notes (Signed)
Pt requesting to see social worker

## 2018-06-13 NOTE — ED Provider Notes (Addendum)
[facility] EMERGENCY DEPARTMENT Provider Note   CSN: [id number] Arrival date & time: 06/13/18  4098     History   Chief Complaint Chief Complaint  Patient presents with  . Neck Pain  . Back Pain    HPI Stacey Harper is a 72 y.o. female.  Patient with history of hypertension on amlodipine, chlorthalidone, lisinopril, history of hyperlipidemia --presents with complaint of a tightness in her left neck and shoulder starting yesterday.  Patient had fleeting chest pain at onset around midnight to 1 AM however this resolved.  She denies any injuries to the area.  No weakness in the upper or lower extremities.  No shortness of breath, palpitations, diaphoresis.  Symptoms are not exertional.  Symptoms are worse with palpation.  No treatments prior to arrival.  Patient states that she walked to the emergency department this morning.  She was worried about her blood pressure causing her symptoms today.  She denies any abdominal pain, nausea, vomiting, or diarrhea. The onset of this condition was acute. The course is constant. Aggravating factors: palpation. Alleviating factors: none.       History reviewed. No pertinent past medical history.  Patient Active Problem List   Diagnosis Date Noted  . Hypertension, isolated systolic 01/24/2018  . Widened pulse pressure 01/24/2018  . Bradycardia 01/24/2018    History reviewed. No pertinent surgical history.   OB History   None      Home Medications    Prior to Admission medications   Medication Sig Start Date End Date Taking? Authorizing Provider  amLODipine (NORVASC) 5 MG tablet Take 5 mg by mouth every morning. 01/24/18   [provider]  atorvastatin (LIPITOR) 20 MG tablet Take 1 tablet (20 mg total) by mouth daily. 01/27/18   Loletta Specter, PA-C  chlorthalidone (HYGROTON) 25 MG tablet Take 1 tablet (25 mg total) by mouth daily. 01/27/18   Loletta Specter, PA-C  lisinopril (PRINIVIL,ZESTRIL) 5 MG  tablet Take 1 tablet (5 mg total) by mouth daily. 04/08/18   Loletta Specter, PA-C    Family History No family history on file.  Social History Social History   Tobacco Use  . Smoking status: Current Every Day Smoker    Packs/day: 0.25    Types: Cigarettes  . Smokeless tobacco: Never Used  Substance Use Topics  . Alcohol use: No    Frequency: Never  . Drug use: No     Allergies   Patient has no allergy information on record.   Review of Systems Review of Systems  Constitutional: Negative for fever and unexpected weight change.  Gastrointestinal: Negative for abdominal pain and constipation.       Negative for fecal incontinence.   Genitourinary: Negative for dysuria, flank pain and hematuria.       Negative for urinary incontinence or retention.  Musculoskeletal: Positive for myalgias and neck pain. Negative for back pain.  Neurological: Negative for weakness and numbness.       Denies saddle paresthesias.     Physical Exam Updated Vital Signs BP (!) 158/76   Pulse 65   Temp 98 F (36.7 C) (Oral)   Resp 18   SpO2 100%   Physical Exam  Constitutional: She appears well-developed and well-nourished.  HENT:  Head: Normocephalic and atraumatic.  Mouth/Throat: Mucous membranes are normal. Mucous membranes are not dry.  Eyes: Conjunctivae are normal.  Neck: Trachea normal and normal range of motion. Neck supple. Normal carotid pulses and no JVD present. No  muscular tenderness present. Carotid bruit is not present. No tracheal deviation present.  Cardiovascular: Normal rate, regular rhythm, S1 normal, S2 normal, normal heart sounds and intact distal pulses. Exam reveals no decreased pulses.  No murmur heard. Pulmonary/Chest: Effort normal. No respiratory distress. She has no wheezes. She exhibits no tenderness.  Abdominal: Soft. Normal aorta and bowel sounds are normal. There is no tenderness. There is no rebound and no guarding.  Musculoskeletal: Normal range of  motion.       Right shoulder: Normal. She exhibits normal range of motion, no tenderness and no bony tenderness.       Left shoulder: She exhibits tenderness and spasm.       Cervical back: She exhibits tenderness (L lateral neck) and spasm. She exhibits normal range of motion and no bony tenderness.       Thoracic back: She exhibits normal range of motion, no tenderness and no bony tenderness.       Back:  Neurological: She is alert.  Skin: Skin is warm and dry. She is not diaphoretic. No cyanosis. No pallor.  Psychiatric: She has a normal mood and affect.  Nursing note and vitals reviewed.    ED Treatments / Results  Labs (all labs ordered are listed, but only abnormal results are displayed) Labs Reviewed - No data to display  EKG EKG Interpretation  Date/Time:  Friday June 13 2018 07:02:22 EDT Ventricular Rate:  64 PR Interval:    QRS Duration: 85 QT Interval:  415 QTC Calculation: 429 R Axis:   37 Text Interpretation:  Sinus rhythm Borderline repolarization abnormality No old tracing to compare Confirmed by Jacalyn Lefevre [phone]) on 06/13/2018 7:05:24 AM   Radiology No results found.  Procedures Procedures (including critical care time)  Medications Ordered in ED Medications  methocarbamol (ROBAXIN) tablet 500 mg (500 mg Oral Given 06/13/18 0654)  acetaminophen (TYLENOL) tablet 650 mg (650 mg Oral Given 06/13/18 0654)     Initial Impression / Assessment and Plan / ED Course  I have reviewed the triage vital signs and the nursing notes.  Pertinent labs & imaging results that were available during my care of the patient were reviewed by me and considered in my medical decision making (see chart for details).     Patient seen and examined. Work-up initiated. Medications ordered.   Vital signs reviewed and are as follows: BP (!) 158/76   Pulse 65   Temp 98 F (36.7 C) (Oral)   Resp 18   SpO2 100%   Patient has risk factors for ACS, however her symptoms today  are atypical.  Will check EKG.  Will give treatment here in emergency department.  EKG reviewed.  Patient has nonspecific T wave changes.  She does not have a complete previous twelve-lead.  Will check troponin.  7:52 AM troponin negative.  We will discharged home with supportive measures for musculoskeletal pain.  Patient encouraged to return for chest pain or shortness of breath.  Patient counseled on proper use of muscle relaxant medication.  They were told not to drink alcohol, drive any vehicle, or do any dangerous activities while taking this medication.  Patient verbalized understanding.  8:57 AM Pt stable, pain improved. D/c home.  Encouraged to return if she develops any recurrent chest pain, shortness of breath or other new symptoms.  9:25 AM Social work consult requested prior to discharge.   Final Clinical Impressions(s) / ED Diagnoses   Final diagnoses:  Muscle spasm of left shoulder area  Patient with left shoulder pain, reports chest pain, short-lived at onset.  EKG shows T wave inversions.  Troponin is negative.  Patient with palpable left shoulder spasm.  It is readily reproducible.  I do not suspect ACS given her exam today.  EKG findings are likely chronic.  She has had no chest pain during ED stay.  No shortness of breath and she appears comfortable.  At this point, we will treat muscle spasm and pain.  Encouraged to return with worsening symptoms.  ED Discharge Orders        Ordered    methocarbamol (ROBAXIN) 500 MG tablet  4 times daily     06/13/18 0855    acetaminophen (TYLENOL) 325 MG tablet  Every 6 hours PRN     06/13/18 0855       Renne CriglerGeiple, Jamile Sivils, PA-C 06/13/18 0859    Renne CriglerGeiple, Nickisha Hum, PA-C 06/13/18 [id number]    Jacalyn LefevreHaviland, Julie, MD 06/13/18 [phone]

## 2018-06-13 NOTE — ED Notes (Signed)
Breakfast Tray Ordered @ 0738-per Karen,RN-called by Marylene LandAngela

## 2018-06-13 NOTE — Discharge Instructions (Signed)
Please read and follow all provided instructions.  Your diagnoses today include:  1. Muscle spasm of left shoulder area     Tests performed today include:  EKG and cardiac enzyme - no heart problem  Vital signs. See below for your results today.   Medications prescribed:   Robaxin (methocarbamol) - muscle relaxer medication  DO NOT drive or perform any activities that require you to be awake and alert because this medicine can make you drowsy.    Tylenol (acetaminophen) -pain medication  Take any prescribed medications only as directed.  Home care instructions:   Follow any educational materials contained in this packet  Follow R.I.C.E. Protocol:  R - rest your injury   I  - use ice on injury without applying directly to skin  C - compress injury with bandage or splint  E - elevate the injury as much as possible  Follow-up instructions: Please follow-up with your primary care provider if you continue to have significant pain in 1 week.  Return instructions:   Please return if your fingers are numb or tingling, appear gray or blue, or you have severe pain (also elevate the arm and loosen splint or wrap if you were given one)  Return with worsening chest pain or shortness of breath.  Please return to the Emergency Department if you experience worsening symptoms.   Please return if you have any other emergent concerns.  Additional Information:  Your vital signs today were: BP (!) 158/76    Pulse 65    Temp 98 F (36.7 C) (Oral)    Resp 18    SpO2 100%  If your blood pressure (BP) was elevated above 135/85 this visit, please have this repeated by your doctor within one month. --------------

## 2018-06-13 NOTE — ED Notes (Signed)
Pt states that she is under "a lot of stress" at present- asked this nurse if stress and tension can cause this pain
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
## 2018-06-13 NOTE — ED Notes (Signed)
Pt given heat packs

## 2018-06-30 ENCOUNTER — Other Ambulatory Visit (INDEPENDENT_AMBULATORY_CARE_PROVIDER_SITE_OTHER): Payer: Self-pay | Admitting: Physician Assistant

## 2018-06-30 DIAGNOSIS — R42 Dizziness and giddiness: Secondary | ICD-10-CM

## 2018-06-30 DIAGNOSIS — I10 Essential (primary) hypertension: Secondary | ICD-10-CM

## 2018-06-30 DIAGNOSIS — E7800 Pure hypercholesterolemia, unspecified: Secondary | ICD-10-CM

## 2018-06-30 MED ORDER — LISINOPRIL 5 MG PO TABS: 5.0000 mg | ORAL_TABLET | Freq: Every day | ORAL | 1 refills | Status: DC

## 2018-06-30 MED ORDER — CHLORTHALIDONE 25 MG PO TABS: 25.0000 mg | ORAL_TABLET | Freq: Every day | ORAL | 1 refills | Status: DC

## 2018-06-30 MED ORDER — ATORVASTATIN CALCIUM 20 MG PO TABS: 20.0000 mg | ORAL_TABLET | Freq: Every day | ORAL | 1 refills | Status: DC

## 2018-06-30 MED ORDER — AMLODIPINE BESYLATE 5 MG PO TABS: 5.0000 mg | ORAL_TABLET | ORAL | 1 refills | Status: DC

## 2018-06-30 NOTE — Telephone Encounter (Signed)
FWD to PCP. Tempestt S Roberts, CMA  

## 2018-07-17 ENCOUNTER — Encounter (HOSPITAL_COMMUNITY): Payer: Self-pay | Admitting: *Deleted

## 2018-07-17 ENCOUNTER — Emergency Department (HOSPITAL_COMMUNITY)
Admission: EM | Admit: 2018-07-17 | Discharge: 2018-07-17 | Disposition: A | Discharge status: Home / Self Care | Payer: Medicare Other | Attending: Emergency Medicine | Admitting: Emergency Medicine

## 2018-07-17 ENCOUNTER — Other Ambulatory Visit: Payer: Self-pay

## 2018-07-17 DIAGNOSIS — F17210 Nicotine dependence, cigarettes, uncomplicated: Secondary | ICD-10-CM | POA: Insufficient documentation

## 2018-07-17 DIAGNOSIS — I10 Essential (primary) hypertension: Secondary | ICD-10-CM | POA: Insufficient documentation

## 2018-07-17 DIAGNOSIS — M7918 Myalgia, other site: Secondary | ICD-10-CM | POA: Insufficient documentation

## 2018-07-17 DIAGNOSIS — Z79899 Other long term (current) drug therapy: Secondary | ICD-10-CM | POA: Diagnosis not present

## 2018-07-17 MED ORDER — ALBUTEROL SULFATE HFA 108 (90 BASE) MCG/ACT IN AERS: 1.0000 | INHALATION_SPRAY | Freq: Four times a day (QID) | RESPIRATORY_TRACT | 0 refills | Status: DC | PRN
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
## 2018-07-17 NOTE — Discharge Instructions (Signed)
Please read attached information. If you experience any new or worsening signs or symptoms please return to the emergency room for evaluation. Please follow-up with your primary care provider or specialist as discussed. Please use medication prescribed only as directed and discontinue taking if you have any concerning signs or symptoms.   °
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
## 2018-07-22 ENCOUNTER — Inpatient Hospital Stay (INDEPENDENT_AMBULATORY_CARE_PROVIDER_SITE_OTHER): Payer: Medicaid - Out of State | Admitting: Physician Assistant

## 2018-07-23 ENCOUNTER — Encounter (HOSPITAL_COMMUNITY): Payer: Self-pay | Admitting: Emergency Medicine

## 2018-07-23 ENCOUNTER — Ambulatory Visit (HOSPITAL_COMMUNITY)
Admission: EM | Admit: 2018-07-23 | Discharge: 2018-07-23 | Disposition: A | Discharge status: Home / Self Care | Payer: Medicare Other | Attending: Family Medicine | Admitting: Family Medicine

## 2018-07-23 DIAGNOSIS — G44329 Chronic post-traumatic headache, not intractable: Secondary | ICD-10-CM

## 2018-07-23 DIAGNOSIS — R2990 Unspecified symptoms and signs involving the nervous system: Secondary | ICD-10-CM

## 2018-07-23 NOTE — Discharge Instructions (Addendum)
Recommend further evaluation and management in the ED Patient aware and in agreement with plan.
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
## 2018-07-24 ENCOUNTER — Emergency Department (HOSPITAL_COMMUNITY): Payer: Medicare Other

## 2018-07-24 ENCOUNTER — Other Ambulatory Visit: Payer: Self-pay

## 2018-07-24 ENCOUNTER — Emergency Department (HOSPITAL_COMMUNITY)
Admission: EM | Admit: 2018-07-24 | Discharge: 2018-07-25 | Disposition: A | Discharge status: Home / Self Care | Payer: Medicare Other | Attending: Emergency Medicine | Admitting: Emergency Medicine

## 2018-07-24 ENCOUNTER — Encounter (HOSPITAL_COMMUNITY): Payer: Self-pay | Admitting: Emergency Medicine

## 2018-07-24 DIAGNOSIS — F17210 Nicotine dependence, cigarettes, uncomplicated: Secondary | ICD-10-CM | POA: Insufficient documentation

## 2018-07-24 DIAGNOSIS — I10 Essential (primary) hypertension: Secondary | ICD-10-CM | POA: Diagnosis not present

## 2018-07-24 DIAGNOSIS — Z79899 Other long term (current) drug therapy: Secondary | ICD-10-CM | POA: Diagnosis not present

## 2018-07-24 DIAGNOSIS — R51 Headache: Secondary | ICD-10-CM | POA: Insufficient documentation

## 2018-07-24 DIAGNOSIS — G4489 Other headache syndrome: Secondary | ICD-10-CM

## 2018-07-24 MED ORDER — IBUPROFEN 800 MG PO TABS
800.0000 mg | ORAL_TABLET | Freq: Once | ORAL | Status: DC
  Filled 2018-07-24: qty 1

## 2018-07-24 MED ORDER — ACETAMINOPHEN 500 MG PO TABS
1000.0000 mg | ORAL_TABLET | Freq: Once | ORAL | Status: DC
  Filled 2018-07-24: qty 2
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
## 2018-07-25 ENCOUNTER — Encounter (HOSPITAL_COMMUNITY): Payer: Self-pay | Admitting: Emergency Medicine

## 2018-07-25 MED ORDER — IBUPROFEN 800 MG PO TABS: 800.0000 mg | ORAL_TABLET | Freq: Three times a day (TID) | ORAL | 0 refills | Status: DC
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
## 2018-08-16 ENCOUNTER — Encounter (HOSPITAL_COMMUNITY): Payer: Self-pay | Admitting: Emergency Medicine

## 2018-08-16 ENCOUNTER — Emergency Department (HOSPITAL_COMMUNITY): Payer: Medicare Other

## 2018-08-16 ENCOUNTER — Observation Stay (HOSPITAL_COMMUNITY)
Admission: EM | Admit: 2018-08-16 | Discharge: 2018-08-17 | Disposition: A | Discharge status: Home / Self Care | Payer: Medicare Other | Attending: Internal Medicine | Admitting: Internal Medicine

## 2018-08-16 ENCOUNTER — Observation Stay (HOSPITAL_BASED_OUTPATIENT_CLINIC_OR_DEPARTMENT_OTHER): Payer: Medicare Other

## 2018-08-16 ENCOUNTER — Other Ambulatory Visit: Payer: Self-pay

## 2018-08-16 DIAGNOSIS — F17210 Nicotine dependence, cigarettes, uncomplicated: Secondary | ICD-10-CM | POA: Insufficient documentation

## 2018-08-16 DIAGNOSIS — F17200 Nicotine dependence, unspecified, uncomplicated: Secondary | ICD-10-CM

## 2018-08-16 DIAGNOSIS — R079 Chest pain, unspecified: Secondary | ICD-10-CM | POA: Diagnosis not present

## 2018-08-16 DIAGNOSIS — E7800 Pure hypercholesterolemia, unspecified: Secondary | ICD-10-CM | POA: Insufficient documentation

## 2018-08-16 DIAGNOSIS — I10 Essential (primary) hypertension: Secondary | ICD-10-CM | POA: Insufficient documentation

## 2018-08-16 DIAGNOSIS — J449 Chronic obstructive pulmonary disease, unspecified: Secondary | ICD-10-CM | POA: Diagnosis not present

## 2018-08-16 DIAGNOSIS — E785 Hyperlipidemia, unspecified: Secondary | ICD-10-CM | POA: Insufficient documentation

## 2018-08-16 DIAGNOSIS — I361 Nonrheumatic tricuspid (valve) insufficiency: Secondary | ICD-10-CM | POA: Diagnosis not present

## 2018-08-16 DIAGNOSIS — R0789 Other chest pain: Principal | ICD-10-CM | POA: Insufficient documentation

## 2018-08-16 DIAGNOSIS — R001 Bradycardia, unspecified: Secondary | ICD-10-CM | POA: Diagnosis not present

## 2018-08-16 DIAGNOSIS — Z79899 Other long term (current) drug therapy: Secondary | ICD-10-CM | POA: Insufficient documentation

## 2018-08-16 DIAGNOSIS — E876 Hypokalemia: Secondary | ICD-10-CM | POA: Diagnosis not present

## 2018-08-16 LAB — ECHOCARDIOGRAM COMPLETE
HEIGHTINCHES: 63 in
Weight: 2800 oz

## 2018-08-16 LAB — CBC
HCT: 37.4 % (ref 36.0–46.0)
Hemoglobin: 12.5 g/dL (ref 12.0–15.0)
MCH: 31.8 pg (ref 26.0–34.0)
MCHC: 33.4 g/dL (ref 30.0–36.0)
MCV: 95.2 fL (ref 78.0–100.0)
PLATELETS: 243 10*3/uL (ref 150–400)
RBC: 3.93 MIL/uL (ref 3.87–5.11)
RDW: 12.1 % (ref 11.5–15.5)
WBC: 8.9 10*3/uL (ref 4.0–10.5)

## 2018-08-16 LAB — BASIC METABOLIC PANEL
Anion gap: 11 (ref 5–15)
BUN: 25 mg/dL — ABNORMAL HIGH (ref 8–23)
CALCIUM: 9.6 mg/dL (ref 8.9–10.3)
CO2: 27 mmol/L (ref 22–32)
CREATININE: 1.19 mg/dL — AB (ref 0.44–1.00)
Chloride: 100 mmol/L (ref 98–111)
GFR, EST AFRICAN AMERICAN: 53 mL/min — AB (ref >60)
GFR, EST NON AFRICAN AMERICAN: 45 mL/min — AB (ref >60)
Glucose, Bld: 102 mg/dL — ABNORMAL HIGH (ref 70–99)
Potassium: 3 mmol/L — ABNORMAL LOW (ref 3.5–5.1)
Sodium: 138 mmol/L (ref 135–145)

## 2018-08-16 LAB — I-STAT TROPONIN, ED: TROPONIN I, POC: 0.01 ng/mL (ref 0.00–0.08)

## 2018-08-16 LAB — MAGNESIUM: MAGNESIUM: 1.9 mg/dL (ref 1.7–2.4)

## 2018-08-16 LAB — TSH: TSH: 0.876 u[IU]/mL (ref 0.350–4.500)

## 2018-08-16 LAB — TROPONIN I: Troponin I: <0.03 ng/mL (ref <0.03)

## 2018-08-16 MED ORDER — PERFLUTREN LIPID MICROSPHERE
1.0000 mL | INTRAVENOUS | Status: AC | PRN
  Administered 2018-08-16: 3 mL via INTRAVENOUS
  Filled 2018-08-16: qty 10

## 2018-08-16 MED ORDER — MELOXICAM 7.5 MG PO TABS
15.0000 mg | ORAL_TABLET | Freq: Every day | ORAL | Status: DC
  Administered 2018-08-16 – 2018-08-17 (×2): 15 mg via ORAL
  Filled 2018-08-16 (×2): qty 2

## 2018-08-16 MED ORDER — LISINOPRIL 5 MG PO TABS
5.0000 mg | ORAL_TABLET | Freq: Every day | ORAL | Status: DC
  Administered 2018-08-17: 5 mg via ORAL
  Filled 2018-08-16: qty 1

## 2018-08-16 MED ORDER — ALBUTEROL SULFATE (2.5 MG/3ML) 0.083% IN NEBU: 2.5000 mg | INHALATION_SOLUTION | RESPIRATORY_TRACT | Status: DC | PRN

## 2018-08-16 MED ORDER — CHLORTHALIDONE 25 MG PO TABS
25.0000 mg | ORAL_TABLET | Freq: Every day | ORAL | Status: DC
  Administered 2018-08-17: 25 mg via ORAL
  Filled 2018-08-16: qty 1

## 2018-08-16 MED ORDER — ENOXAPARIN SODIUM 40 MG/0.4ML ~~LOC~~ SOLN
40.0000 mg | SUBCUTANEOUS | Status: DC
  Administered 2018-08-16: 40 mg via SUBCUTANEOUS
  Filled 2018-08-16: qty 0.4

## 2018-08-16 MED ORDER — SODIUM CHLORIDE 0.9% FLUSH
3.0000 mL | Freq: Two times a day (BID) | INTRAVENOUS | Status: DC
  Administered 2018-08-16: 3 mL via INTRAVENOUS

## 2018-08-16 MED ORDER — POTASSIUM CHLORIDE CRYS ER 20 MEQ PO TBCR
40.0000 meq | EXTENDED_RELEASE_TABLET | Freq: Once | ORAL | Status: AC
  Administered 2018-08-16: 40 meq via ORAL
  Filled 2018-08-16: qty 2

## 2018-08-16 MED ORDER — SODIUM CHLORIDE 0.9% FLUSH
3.0000 mL | Freq: Two times a day (BID) | INTRAVENOUS | Status: DC
  Administered 2018-08-16 – 2018-08-17 (×3): 3 mL via INTRAVENOUS

## 2018-08-16 MED ORDER — ACETAMINOPHEN 650 MG RE SUPP: 650.0000 mg | Freq: Four times a day (QID) | RECTAL | Status: DC | PRN

## 2018-08-16 MED ORDER — ACETAMINOPHEN 325 MG PO TABS: 650.0000 mg | ORAL_TABLET | Freq: Four times a day (QID) | ORAL | Status: DC | PRN

## 2018-08-16 MED ORDER — SODIUM CHLORIDE 0.9% FLUSH
3.0000 mL | INTRAVENOUS | Status: DC | PRN
  Administered 2018-08-17: 3 mL via INTRAVENOUS
  Filled 2018-08-16: qty 3

## 2018-08-16 MED ORDER — IOPAMIDOL (ISOVUE-370) INJECTION 76%
INTRAVENOUS | Status: AC
  Administered 2018-08-16: 60 mL
  Filled 2018-08-16: qty 100

## 2018-08-16 MED ORDER — ATORVASTATIN CALCIUM 20 MG PO TABS
20.0000 mg | ORAL_TABLET | Freq: Every day | ORAL | Status: DC
  Administered 2018-08-17: 20 mg via ORAL
  Filled 2018-08-16: qty 1

## 2018-08-16 MED ORDER — SODIUM CHLORIDE 0.9 % IV SOLN: 250.0000 mL | INTRAVENOUS | Status: DC | PRN

## 2018-08-16 MED ORDER — IPRATROPIUM-ALBUTEROL 0.5-2.5 (3) MG/3ML IN SOLN
3.0000 mL | Freq: Once | RESPIRATORY_TRACT | Status: AC
  Administered 2018-08-16: 3 mL via RESPIRATORY_TRACT
  Filled 2018-08-16: qty 3

## 2018-08-16 NOTE — ED Notes (Signed)
Patient's husband came to the front desk adamantly expressing that she "Has the same thing as me. She should not be given ANY percocet or oxycontin. She won't be able to drive home and she just needs something for spasms." He stated that his condition had to do with his gallbladder and we need to treat her with "heat from our xray" instead. I assured him that the doctor would determine her treatment and then discuss it with the patient. I assured him that we would let the treatment team know his concerns.

## 2018-08-16 NOTE — ED Triage Notes (Signed)
TC to ED to get report from Primary RN

## 2018-08-16 NOTE — Progress Notes (Signed)
Pt AO , HR 54 . Family in room.

## 2018-08-16 NOTE — Progress Notes (Signed)
Pt in room resting ,Family  In room.

## 2018-08-16 NOTE — Progress Notes (Signed)
Pt sleeping ,family in room. Pt on monitor HR 56.

## 2018-08-16 NOTE — Progress Notes (Signed)
Pt resting.
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
## 2018-08-16 NOTE — ED Notes (Signed)
Gave patient some water per Dr Particia Nearing patient is resting with call bell in reach and family at bedside

## 2018-08-16 NOTE — Progress Notes (Signed)
  Echocardiogram 2D Echocardiogram with definity has been performed.  Leta Jungling M 08/16/2018, 2:30 PM
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
## 2018-08-16 NOTE — Progress Notes (Signed)
Pt AO , resting .

## 2018-08-16 NOTE — ED Triage Notes (Signed)
Pt reports intermittent central cp that radiates to her back and neck the last few days. Pt reports dizziness upon standing. Hx of HTN and is compliant with meds.

## 2018-08-16 NOTE — ED Notes (Signed)
Patient's husband came to the front desk adamantly expressing that she "has the exact same same thing as me. It's just spasms and she MUST not have any oxycontin or percocet!" He explained his condition as having gallstones  I explained to him that treatment would be determined by the doctor and discussed with the patient and we would most definitely let her treatment team know his wishes.

## 2018-08-17 ENCOUNTER — Other Ambulatory Visit: Payer: Self-pay

## 2018-08-17 DIAGNOSIS — R0789 Other chest pain: Secondary | ICD-10-CM | POA: Diagnosis not present

## 2018-08-17 LAB — HIV ANTIBODY (ROUTINE TESTING W REFLEX): HIV SCREEN 4TH GENERATION: NONREACTIVE

## 2018-08-17 LAB — BASIC METABOLIC PANEL
ANION GAP: 9 (ref 5–15)
BUN: 20 mg/dL (ref 8–23)
CO2: 27 mmol/L (ref 22–32)
Calcium: 9.3 mg/dL (ref 8.9–10.3)
Chloride: 100 mmol/L (ref 98–111)
Creatinine, Ser: 1.1 mg/dL — ABNORMAL HIGH (ref 0.44–1.00)
GFR calc Af Amer: 58 mL/min — ABNORMAL LOW (ref >60)
GFR calc non Af Amer: 50 mL/min — ABNORMAL LOW (ref >60)
GLUCOSE: 110 mg/dL — AB (ref 70–99)
POTASSIUM: 3.7 mmol/L (ref 3.5–5.1)
SODIUM: 136 mmol/L (ref 135–145)

## 2018-08-17 LAB — TROPONIN I

## 2018-08-17 NOTE — Progress Notes (Signed)
Pt resting at this time.

## 2018-08-17 NOTE — Progress Notes (Signed)
Pt stable, ambulatory, and verbalizes understanding of d/c instructions. Pt nor husband had any further questions.  Pt has belongings in her possession.
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
## 2018-08-17 NOTE — Progress Notes (Signed)
Pt going to bed and family staying at the bedside.

## 2018-08-17 NOTE — Progress Notes (Signed)
Pt blood work draw at this time phlebotomy

## 2018-08-17 NOTE — Progress Notes (Signed)
Pt awake and family at bedside.

## 2018-08-17 NOTE — Discharge Instructions (Signed)
Bradycardia, Adult °Bradycardia is a slower-than-normal heartbeat. A normal resting heart rate for an adult ranges from 60 to 100 beats per minute. With bradycardia, the resting heart rate is less than 60 beats per minute. °Bradycardia can prevent enough oxygen from reaching certain areas of your body when you are active. It can be serious if it keeps enough oxygen from reaching your brain and other parts of your body. Bradycardia is not a problem for everyone. For some healthy adults, a slow resting heart rate is normal. °What are the causes? °This condition may be caused by: °· A problem with the heart, including: °? A problem with the heart's electrical system, such as a heart block. °? A problem with the heart's natural pacemaker (sinus node). °? Heart disease. °? A heart attack. °? Heart damage. °? A heart infection. °? A heart condition that is present at birth (congenital heart defect). °· Certain medicines that treat heart conditions. °· Certain conditions, such as hypothyroidism and obstructive sleep apnea. °· Problems with the balance of chemicals and other substances, like potassium, in the blood. ° °What increases the risk? °This condition is more likely to develop in adults who: °· Are age 65 or older. °· Have high blood pressure (hypertension), high cholesterol (hyperlipidemia), or diabetes. °· Drink heavily, use tobacco or nicotine products, or use drugs. °· Are stressed. ° °What are the signs or symptoms? °Symptoms of this condition include: °· Light-headedness. °· Feeling faint or fainting. °· Fatigue and weakness. °· Shortness of breath. °· Chest pain (angina). °· Drowsiness. °· Confusion. °· Dizziness. ° °How is this diagnosed? °This condition may be diagnosed based on: °· Your symptoms. °· Your medical history. °· A physical exam. ° °During the exam, your health care provider will listen to your heartbeat and check your pulse. To confirm the diagnosis, your health care provider may order tests,  such as: °· Blood tests. °· An electrocardiogram (ECG). This test records the heart's electrical activity. The test can show how fast your heart is beating and whether the heartbeat is steady. °· A test in which you wear a portable device (event recorder or Holter monitor) to record your heart's electrical activity while you go about your day. °· An exercise test. ° °How is this treated? °Treatment for this condition depends on the cause of the condition and how severe your symptoms are. Treatment may involve: °· Treatment of the underlying condition. °· Changing your medicines or how much medicine you take. °· Having a small, battery-operated device called a pacemaker implanted under the skin. When bradycardia occurs, this device can be used to increase your heart rate and help your heart to beat in a regular rhythm. ° °Follow these instructions at home: °Lifestyle ° °· Manage any health conditions that contribute to bradycardia as told by your health care provider. °· Follow a heart-healthy diet. A nutrition specialist (dietitian) can help to educate you about healthy food options and changes. °· Follow an exercise program that is approved by your health care provider. °· Maintain a healthy weight. °· Try to reduce or manage your stress, such as with yoga or meditation. If you need help reducing stress, ask your health care provider. °· Do not use use any products that contain nicotine or tobacco, such as cigarettes and e-cigarettes. If you need help quitting, ask your health care provider. °· Do not use illegal drugs. °· Limit alcohol intake to no more than 1 drink per day for nonpregnant women and 2 drinks per   day for men. One drink equals 12 oz of beer, 5 oz of wine, or 1½ oz of hard liquor. °General instructions °· Take over-the-counter and prescription medicines only as told by your health care provider. °· Keep all follow-up visits as directed by your health care provider. This is important. °How is this  prevented? °In some cases, bradycardia may be prevented by: °· Treating underlying medical problems. °· Stopping behaviors or medicines that can trigger the condition. ° °Contact a health care provider if: °· You feel light-headed or dizzy. °· You almost faint. °· You feel weak or are easily fatigued during physical activity. °· You experience confusion or have memory problems. °Get help right away if: °· You faint. °· You have an irregular heartbeat (palpitations). °· You have chest pain. °· You have trouble breathing. °This information is not intended to replace advice given to you by your health care provider. Make sure you discuss any questions you have with your health care provider. °Document Released: 08/18/2002 Document Revised: 07/24/2016 Document Reviewed: 05/17/2016 °Elsevier Interactive Patient Education © 2017 [employer] ° °

## 2018-08-18 ENCOUNTER — Telehealth (INDEPENDENT_AMBULATORY_CARE_PROVIDER_SITE_OTHER): Payer: Self-pay

## 2018-08-18 NOTE — Telephone Encounter (Signed)
-----   Message from Roger David Gomez, PA-C sent at 08/18/2018  1:32 PM EDT ----- HIV negative. 

## 2018-08-18 NOTE — Telephone Encounter (Signed)
Patients husband stated that patient was at a physical therapy appointment and he would have her call [facility] in about an hour. Maryjean Morn, CMA

## 2018-08-19 ENCOUNTER — Telehealth (INDEPENDENT_AMBULATORY_CARE_PROVIDER_SITE_OTHER): Payer: Self-pay

## 2018-08-19 NOTE — Telephone Encounter (Signed)
Patient is aware that HIV is negative. Tempestt S Roberts, CMA  

## 2018-08-19 NOTE — Telephone Encounter (Signed)
-----   Message from Loletta Specter, PA-C sent at 08/18/2018  1:32 PM EDT ----- HIV negative.

## 2018-08-20 ENCOUNTER — Ambulatory Visit: Payer: Medicare Other | Attending: Family Medicine | Admitting: Licensed Clinical Social Worker

## 2018-08-20 ENCOUNTER — Other Ambulatory Visit: Payer: Self-pay

## 2018-08-20 ENCOUNTER — Encounter (INDEPENDENT_AMBULATORY_CARE_PROVIDER_SITE_OTHER): Payer: Self-pay | Admitting: Physician Assistant

## 2018-08-20 ENCOUNTER — Encounter

## 2018-08-20 ENCOUNTER — Ambulatory Visit (INDEPENDENT_AMBULATORY_CARE_PROVIDER_SITE_OTHER): Payer: Medicare Other | Admitting: Physician Assistant

## 2018-08-20 VITALS — BP 126/79 | HR 55 | Temp 97.6°F | Ht 63.0 in | Wt 175.4 lb

## 2018-08-20 DIAGNOSIS — F17200 Nicotine dependence, unspecified, uncomplicated: Secondary | ICD-10-CM | POA: Diagnosis not present

## 2018-08-20 DIAGNOSIS — E7800 Pure hypercholesterolemia, unspecified: Secondary | ICD-10-CM | POA: Diagnosis not present

## 2018-08-20 DIAGNOSIS — R0789 Other chest pain: Secondary | ICD-10-CM

## 2018-08-20 DIAGNOSIS — F419 Anxiety disorder, unspecified: Secondary | ICD-10-CM

## 2018-08-20 DIAGNOSIS — Z760 Encounter for issue of repeat prescription: Secondary | ICD-10-CM | POA: Diagnosis not present

## 2018-08-20 DIAGNOSIS — R0609 Other forms of dyspnea: Secondary | ICD-10-CM

## 2018-08-20 MED ORDER — NICOTINE 7 MG/24HR TD PT24: 7.0000 mg | MEDICATED_PATCH | Freq: Every day | TRANSDERMAL | 1 refills | Status: DC

## 2018-08-20 MED ORDER — ATORVASTATIN CALCIUM 20 MG PO TABS: 20.0000 mg | ORAL_TABLET | Freq: Every day | ORAL | 1 refills | Status: DC

## 2018-08-20 MED ORDER — CHLORTHALIDONE 25 MG PO TABS: 25.0000 mg | ORAL_TABLET | Freq: Every day | ORAL | 1 refills | Status: DC
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
## 2018-08-20 NOTE — Patient Instructions (Signed)
Chest Wall Pain °Chest wall pain is pain in or around the bones and muscles of your chest. Sometimes, an injury causes this pain. Sometimes, the cause may not be known. This pain may take several weeks or longer to get better. °Follow these instructions at home: °Pay attention to any changes in your symptoms. Take these actions to help with your pain: °· Rest as told by your doctor. °· Avoid activities that cause pain. Try not to use your chest, belly (abdominal), or side muscles to lift heavy things. °· If directed, apply ice to the painful area: °? Put ice in a plastic bag. °? Place a towel between your skin and the bag. °? Leave the ice on for 20 minutes, 2-3 times per day. °· Take over-the-counter and prescription medicines only as told by your doctor. °· Do not use tobacco products, including cigarettes, chewing tobacco, and e-cigarettes. If you need help quitting, ask your doctor. °· Keep all follow-up visits as told by your doctor. This is important. ° °Contact a doctor if: °· You have a fever. °· Your chest pain gets worse. °· You have new symptoms. °Get help right away if: °· You feel sick to your stomach (nauseous) or you throw up (vomit). °· You feel sweaty or light-headed. °· You have a cough with phlegm (sputum) or you cough up blood. °· You are short of breath. °This information is not intended to replace advice given to you by your health care provider. Make sure you discuss any questions you have with your health care provider. °Document Released: 05/14/2008 Document Revised: 05/03/2016 Document Reviewed: 02/21/2015 °Elsevier Interactive Patient Education © 2018 [employer]. ° °
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
## 2018-08-20 NOTE — Progress Notes (Signed)
Pt needs an inhaler

## 2018-08-21 LAB — LIPID PANEL
CHOL/HDL RATIO: 4.9 ratio — AB (ref 0.0–4.4)
CHOLESTEROL TOTAL: 212 mg/dL — AB (ref 100–199)
HDL: 43 mg/dL (ref >39)
LDL Calculated: 147 mg/dL — ABNORMAL HIGH (ref 0–99)
Triglycerides: 110 mg/dL (ref 0–149)
VLDL Cholesterol Cal: 22 mg/dL (ref 5–40)

## 2018-08-22 ENCOUNTER — Other Ambulatory Visit (INDEPENDENT_AMBULATORY_CARE_PROVIDER_SITE_OTHER): Payer: Self-pay | Admitting: Physician Assistant

## 2018-08-22 ENCOUNTER — Telehealth (INDEPENDENT_AMBULATORY_CARE_PROVIDER_SITE_OTHER): Payer: Self-pay

## 2018-08-22 MED ORDER — ATORVASTATIN CALCIUM 40 MG PO TABS: 40.0000 mg | ORAL_TABLET | Freq: Every day | ORAL | 3 refills | Status: DC

## 2018-08-22 NOTE — Telephone Encounter (Signed)
-----   Message from Loletta Specteroger David Gomez, PA-C sent at 08/22/2018  8:41 AM EDT ----- Bad cholesterol higher than previous. I have increased her atorvastatin from 20 mg to 40 mg qhs.

## 2018-08-22 NOTE — Telephone Encounter (Signed)
Left voicemail notifying patient that bad cholesterol is higher than previous reading. Atorvastatin was increased from 20 mg to 40 mg at bedtime. Call [facility] with any questions or concerns. Stacey Harper, CMA

## 2018-09-17 ENCOUNTER — Other Ambulatory Visit: Payer: Self-pay

## 2018-09-17 ENCOUNTER — Encounter (INDEPENDENT_AMBULATORY_CARE_PROVIDER_SITE_OTHER): Payer: Self-pay | Admitting: Physician Assistant

## 2018-09-17 ENCOUNTER — Ambulatory Visit (INDEPENDENT_AMBULATORY_CARE_PROVIDER_SITE_OTHER): Payer: Medicare Other | Admitting: Physician Assistant

## 2018-09-17 VITALS — BP 108/69 | HR 55 | Temp 97.9°F | Ht 63.0 in | Wt 180.0 lb

## 2018-09-17 DIAGNOSIS — M25572 Pain in left ankle and joints of left foot: Secondary | ICD-10-CM | POA: Diagnosis not present

## 2018-09-17 DIAGNOSIS — B351 Tinea unguium: Secondary | ICD-10-CM | POA: Diagnosis not present

## 2018-09-17 DIAGNOSIS — L600 Ingrowing nail: Secondary | ICD-10-CM | POA: Diagnosis not present

## 2018-09-17 DIAGNOSIS — Z79899 Other long term (current) drug therapy: Secondary | ICD-10-CM

## 2018-09-17 DIAGNOSIS — M25571 Pain in right ankle and joints of right foot: Secondary | ICD-10-CM | POA: Diagnosis not present

## 2018-09-17 DIAGNOSIS — G8929 Other chronic pain: Secondary | ICD-10-CM | POA: Diagnosis not present
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
## 2018-09-17 NOTE — Patient Instructions (Signed)

## 2018-09-18 ENCOUNTER — Other Ambulatory Visit (INDEPENDENT_AMBULATORY_CARE_PROVIDER_SITE_OTHER): Payer: Self-pay | Admitting: Physician Assistant

## 2018-09-18 ENCOUNTER — Telehealth (INDEPENDENT_AMBULATORY_CARE_PROVIDER_SITE_OTHER): Payer: Self-pay

## 2018-09-18 DIAGNOSIS — B351 Tinea unguium: Secondary | ICD-10-CM

## 2018-09-18 LAB — HEPATIC FUNCTION PANEL
ALT: 15 IU/L (ref 0–32)
AST: 15 IU/L (ref 0–40)
Albumin: 4.3 g/dL (ref 3.6–4.8)
Alkaline Phosphatase: 82 IU/L (ref 39–117)
BILIRUBIN TOTAL: 0.8 mg/dL (ref 0.0–1.2)
Bilirubin, Direct: 0.19 mg/dL (ref 0.00–0.40)
Total Protein: 7.3 g/dL (ref 6.0–8.5)

## 2018-09-18 MED ORDER — TERBINAFINE HCL 250 MG PO TABS: 250.0000 mg | ORAL_TABLET | Freq: Every day | ORAL | 0 refills | Status: DC

## 2018-09-18 NOTE — Telephone Encounter (Signed)
-----   Message from Loletta Specter, PA-C sent at 09/18/2018  8:42 AM EDT ----- Liver result is normal. Pt can take Terbinafine for toe nail fungus. I have sent to [facility] on [address].

## 2018-09-18 NOTE — Telephone Encounter (Signed)
Patient is aware that liver result is normal. Ok to take terbinafine for toe nail fungus; terbinafine has been sent to [facility] on [address]. Maryjean Morn, CMA

## 2018-10-01 ENCOUNTER — Ambulatory Visit (INDEPENDENT_AMBULATORY_CARE_PROVIDER_SITE_OTHER): Payer: Medicaid Other | Admitting: Physician Assistant

## 2018-10-07 ENCOUNTER — Ambulatory Visit (INDEPENDENT_AMBULATORY_CARE_PROVIDER_SITE_OTHER): Payer: Medicare Other | Admitting: Podiatry

## 2018-10-07 ENCOUNTER — Ambulatory Visit (INDEPENDENT_AMBULATORY_CARE_PROVIDER_SITE_OTHER): Payer: Medicare Other

## 2018-10-07 ENCOUNTER — Ambulatory Visit: Payer: Medicare Other | Admitting: Podiatry

## 2018-10-07 DIAGNOSIS — M722 Plantar fascial fibromatosis: Secondary | ICD-10-CM | POA: Diagnosis not present

## 2018-10-07 DIAGNOSIS — M779 Enthesopathy, unspecified: Secondary | ICD-10-CM

## 2018-10-07 DIAGNOSIS — B351 Tinea unguium: Secondary | ICD-10-CM

## 2018-10-07 DIAGNOSIS — L600 Ingrowing nail: Secondary | ICD-10-CM | POA: Diagnosis not present

## 2018-10-07 DIAGNOSIS — M79673 Pain in unspecified foot: Secondary | ICD-10-CM

## 2018-10-07 MED ORDER — MELOXICAM 15 MG PO TABS: 15.0000 mg | ORAL_TABLET | Freq: Every day | ORAL | 0 refills | Status: DC

## 2018-10-07 MED ORDER — TERBINAFINE HCL 250 MG PO TABS: 250.0000 mg | ORAL_TABLET | Freq: Every day | ORAL | 0 refills | Status: DC

## 2018-10-07 NOTE — Patient Instructions (Signed)
Plantar Fasciitis (Heel Spur Syndrome) with Rehab The plantar fascia is a fibrous, ligament-like, soft-tissue structure that spans the bottom of the foot. Plantar fasciitis is a condition that causes pain in the foot due to inflammation of the tissue. SYMPTOMS   Pain and tenderness on the underneath side of the foot.  Pain that worsens with standing or walking. CAUSES  Plantar fasciitis is caused by irritation and injury to the plantar fascia on the underneath side of the foot. Common mechanisms of injury include:  Direct trauma to bottom of the foot.  Damage to a small nerve that runs under the foot where the main fascia attaches to the heel bone.  Stress placed on the plantar fascia due to bone spurs. RISK INCREASES WITH:   Activities that place stress on the plantar fascia (running, jumping, pivoting, or cutting).  Poor strength and flexibility.  Improperly fitted shoes.  Tight calf muscles.  Flat feet.  Failure to warm-up properly before activity.  Obesity. PREVENTION  Warm up and stretch properly before activity.  Allow for adequate recovery between workouts.  Maintain physical fitness:  Strength, flexibility, and endurance.  Cardiovascular fitness.  Maintain a health body weight.  Avoid stress on the plantar fascia.  Wear properly fitted shoes, including arch supports for individuals who have flat feet.  PROGNOSIS  If treated properly, then the symptoms of plantar fasciitis usually resolve without surgery. However, occasionally surgery is necessary.  RELATED COMPLICATIONS   Recurrent symptoms that may result in a chronic condition.  Problems of the lower back that are caused by compensating for the injury, such as limping.  Pain or weakness of the foot during push-off following surgery.  Chronic inflammation, scarring, and partial or complete fascia tear, occurring more often from repeated injections.  TREATMENT  Treatment initially involves the  use of ice and medication to help reduce pain and inflammation. The use of strengthening and stretching exercises may help reduce pain with activity, especially stretches of the Achilles tendon. These exercises may be performed at home or with a therapist. Your caregiver may recommend that you use heel cups of arch supports to help reduce stress on the plantar fascia. Occasionally, corticosteroid injections are given to reduce inflammation. If symptoms persist for greater than 6 months despite non-surgical (conservative), then surgery may be recommended.   MEDICATION   If pain medication is necessary, then nonsteroidal anti-inflammatory medications, such as aspirin and ibuprofen, or other minor pain relievers, such as acetaminophen, are often recommended.  Do not take pain medication within 7 days before surgery.  Prescription pain relievers may be given if deemed necessary by your caregiver. Use only as directed and only as much as you need.  Corticosteroid injections may be given by your caregiver. These injections should be reserved for the most serious cases, because they may only be given a certain number of times.  HEAT AND COLD  Cold treatment (icing) relieves pain and reduces inflammation. Cold treatment should be applied for 10 to 15 minutes every 2 to 3 hours for inflammation and pain and immediately after any activity that aggravates your symptoms. Use ice packs or massage the area with a piece of ice (ice massage).  Heat treatment may be used prior to performing the stretching and strengthening activities prescribed by your caregiver, physical therapist, or athletic trainer. Use a heat pack or soak the injury in warm water.  SEEK IMMEDIATE MEDICAL CARE IF:  Treatment seems to offer no benefit, or the condition worsens.  Any medications   produce adverse side effects.  EXERCISES- RANGE OF MOTION (ROM) AND STRETCHING EXERCISES - Plantar Fasciitis (Heel Spur Syndrome) These exercises  may help you when beginning to rehabilitate your injury. Your symptoms may resolve with or without further involvement from your physician, physical therapist or athletic trainer. While completing these exercises, remember:   Restoring tissue flexibility helps normal motion to return to the joints. This allows healthier, less painful movement and activity.  An effective stretch should be held for at least 30 seconds.  A stretch should never be painful. You should only feel a gentle lengthening or release in the stretched tissue.  RANGE OF MOTION - Toe Extension, Flexion  Sit with your right / left leg crossed over your opposite knee.  Grasp your toes and gently pull them back toward the top of your foot. You should feel a stretch on the bottom of your toes and/or foot.  Hold this stretch for 10 seconds.  Now, gently pull your toes toward the bottom of your foot. You should feel a stretch on the top of your toes and or foot.  Hold this stretch for 10 seconds. Repeat  times. Complete this stretch 3 times per day.   RANGE OF MOTION - Ankle Dorsiflexion, Active Assisted  Remove shoes and sit on a chair that is preferably not on a carpeted surface.  Place right / left foot under knee. Extend your opposite leg for support.  Keeping your heel down, slide your right / left foot back toward the chair until you feel a stretch at your ankle or calf. If you do not feel a stretch, slide your bottom forward to the edge of the chair, while still keeping your heel down.  Hold this stretch for 10 seconds. Repeat 3 times. Complete this stretch 2 times per day.   STRETCH  Gastroc, Standing  Place hands on wall.  Extend right / left leg, keeping the front knee somewhat bent.  Slightly point your toes inward on your back foot.  Keeping your right / left heel on the floor and your knee straight, shift your weight toward the wall, not allowing your back to arch.  You should feel a gentle stretch  in the right / left calf. Hold this position for 10 seconds. Repeat 3 times. Complete this stretch 2 times per day.  STRETCH  Soleus, Standing  Place hands on wall.  Extend right / left leg, keeping the other knee somewhat bent.  Slightly point your toes inward on your back foot.  Keep your right / left heel on the floor, bend your back knee, and slightly shift your weight over the back leg so that you feel a gentle stretch deep in your back calf.  Hold this position for 10 seconds. Repeat 3 times. Complete this stretch 2 times per day.  STRETCH  Gastrocsoleus, Standing  Note: This exercise can place a lot of stress on your foot and ankle. Please complete this exercise only if specifically instructed by your caregiver.   Place the ball of your right / left foot on a step, keeping your other foot firmly on the same step.  Hold on to the wall or a rail for balance.  Slowly lift your other foot, allowing your body weight to press your heel down over the edge of the step.  You should feel a stretch in your right / left calf.  Hold this position for 10 seconds.  Repeat this exercise with a slight bend in your right /   left knee. Repeat 3 times. Complete this stretch 2 times per day.   STRENGTHENING EXERCISES - Plantar Fasciitis (Heel Spur Syndrome)  These exercises may help you when beginning to rehabilitate your injury. They may resolve your symptoms with or without further involvement from your physician, physical therapist or athletic trainer. While completing these exercises, remember:   Muscles can gain both the endurance and the strength needed for everyday activities through controlled exercises.  Complete these exercises as instructed by your physician, physical therapist or athletic trainer. Progress the resistance and repetitions only as guided.  STRENGTH - Towel Curls  Sit in a chair positioned on a non-carpeted surface.  Place your foot on a towel, keeping your heel  on the floor.  Pull the towel toward your heel by only curling your toes. Keep your heel on the floor. Repeat 3 times. Complete this exercise 2 times per day.  STRENGTH - Ankle Inversion  Secure one end of a rubber exercise band/tubing to a fixed object (table, pole). Loop the other end around your foot just before your toes.  Place your fists between your knees. This will focus your strengthening at your ankle.  Slowly, pull your big toe up and in, making sure the band/tubing is positioned to resist the entire motion.  Hold this position for 10 seconds.  Have your muscles resist the band/tubing as it slowly pulls your foot back to the starting position. Repeat 3 times. Complete this exercises 2 times per day.  Document Released: 11/26/2005 Document Revised: 02/18/2012 Document Reviewed: 03/10/2009 [employer] Patient Information 2014 [employer]   Terbinafine oral granules What is this medicine? TERBINAFINE (TER bin a feen) is an antifungal medicine. It is used to treat certain kinds of fungal or yeast infections. This medicine may be used for other purposes; ask your health care provider or pharmacist if you have questions. COMMON BRAND NAME(S): Lamisil What should I tell my health care provider before I take this medicine? They need to know if you have any of these conditions: -drink alcoholic beverages -kidney disease -liver disease -an unusual or allergic reaction to Terbinafine, other medicines, foods, dyes, or preservatives -pregnant or trying to get pregnant -breast-feeding How should I use this medicine? Take this medicine by mouth. Follow the directions on the prescription label. Hold packet with cut line on top. Shake packet gently to settle contents. Tear packet open along cut line, or use scissors to cut across line. Carefully pour the entire contents of packet onto a spoonful of a soft food, such as pudding or other soft, non-acidic food such as mashed potatoes (do  NOT use applesauce or a fruit-based food). If two packets are required for each dose, you may either sprinkle the content of both packets on one spoonful of non-acidic food, or sprinkle the contents of both packets on two spoonfuls of non-acidic food. Make sure that no granules remain in the packet. Swallow the mxiture of the food and granules without chewing. Take your medicine at regular intervals. Do not take it more often than directed. Take all of your medicine as directed even if you think you are better. Do not skip doses or stop your medicine early. Contact your pediatrician or health care professional regarding the use of this medicine in children. While this medicine may be prescribed for children as young as 4 years for selected conditions, precautions do apply. Overdosage: If you think you have taken too much of this medicine contact a poison control center or emergency room   at once. NOTE: This medicine is only for you. Do not share this medicine with others. What if I miss a dose? If you miss a dose, take it as soon as you can. If it is almost time for your next dose, take only that dose. Do not take double or extra doses. What may interact with this medicine? Do not take this medicine with any of the following medications: -thioridazine This medicine may also interact with the following medications: -beta-blockers -caffeine -cimetidine -cyclosporine -MAOIs like Carbex, Eldepryl, Marplan, Nardil, and Parnate -medicines for fungal infections like fluconazole and ketoconazole -medicines for irregular heartbeat like amiodarone, flecainide and propafenone -rifampin -SSRIs like citalopram, escitalopram, fluoxetine, fluvoxamine, paroxetine and sertraline -tricyclic antidepressants like amitriptyline, clomipramine, desipramine, imipramine, nortriptyline, and others -warfarin This list may not describe all possible interactions. Give your health care provider a list of all the medicines,  herbs, non-prescription drugs, or dietary supplements you use. Also tell them if you smoke, drink alcohol, or use illegal drugs. Some items may interact with your medicine. What should I watch for while using this medicine? Your doctor may monitor your liver function. Tell your doctor right away if you have nausea or vomiting, loss of appetite, stomach pain on your right upper side, yellow skin, dark urine, light stools, or are over tired. You need to take this medicine for 6 weeks or longer to cure the fungal infection. Take your medicine regularly for as long as your doctor or health care professional tells you to. What side effects may I notice from receiving this medicine? Side effects that you should report to your doctor or health care professional as soon as possible: -allergic reactions like skin rash or hives, swelling of the face, lips, or tongue -change in vision -dark urine -fever or infection -general ill feeling or flu-like symptoms -light-colored stools -loss of appetite, nausea -redness, blistering, peeling or loosening of the skin, including inside the mouth -right upper belly pain -unusually weak or tired -yellowing of the eyes or skin Side effects that usually do not require medical attention (report to your doctor or health care professional if they continue or are bothersome): -changes in taste -diarrhea -hair loss -muscle or joint pain -stomach upset This list may not describe all possible side effects. Call your doctor for medical advice about side effects. You may report side effects to FDA at 1-800-FDA-1088. Where should I keep my medicine? Keep out of the reach of children. Store at room temperature between 15 and 30 degrees C (59 and 86 degrees F). Throw away any unused medicine after the expiration date. NOTE: This sheet is a summary. It may not cover all possible information. If you have questions about this medicine, talk to your doctor, pharmacist, or health  care provider.  2018 Elsevier/Gold Standard (2008-02-06 17:25:48)  
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
## 2018-11-18 ENCOUNTER — Ambulatory Visit (INDEPENDENT_AMBULATORY_CARE_PROVIDER_SITE_OTHER): Payer: Medicare Other | Admitting: Physician Assistant

## 2018-11-18 ENCOUNTER — Ambulatory Visit: Payer: Medicare Other | Admitting: Podiatry

## 2019-02-25 ENCOUNTER — Other Ambulatory Visit (INDEPENDENT_AMBULATORY_CARE_PROVIDER_SITE_OTHER): Payer: Self-pay | Admitting: *Deleted

## 2019-03-26 ENCOUNTER — Other Ambulatory Visit: Payer: Self-pay

## 2019-03-26 ENCOUNTER — Ambulatory Visit: Payer: Medicare Other | Attending: Primary Care | Admitting: *Deleted

## 2019-03-26 ENCOUNTER — Telehealth: Payer: Self-pay | Admitting: *Deleted

## 2019-03-26 DIAGNOSIS — I10 Essential (primary) hypertension: Secondary | ICD-10-CM

## 2019-03-26 NOTE — Telephone Encounter (Signed)
Patient is aware of BP being normal and will receive a FU phone call for results and refills.

## 2019-03-27 LAB — COMPREHENSIVE METABOLIC PANEL
ALT: 20 IU/L (ref 0–32)
AST: 18 IU/L (ref 0–40)
Albumin/Globulin Ratio: 1.5 (ref 1.2–2.2)
Albumin: 4.5 g/dL (ref 3.8–4.8)
Alkaline Phosphatase: 104 IU/L (ref 39–117)
BUN/Creatinine Ratio: 23 (ref 12–28)
BUN: 23 mg/dL (ref 8–27)
Bilirubin Total: 0.6 mg/dL (ref 0.0–1.2)
CO2: 30 mmol/L — ABNORMAL HIGH (ref 20–29)
Calcium: 10 mg/dL (ref 8.7–10.3)
Chloride: 95 mmol/L — ABNORMAL LOW (ref 96–106)
Creatinine, Ser: 1 mg/dL (ref 0.57–1.00)
GFR calc Af Amer: 66 mL/min/{1.73_m2} (ref >59)
GFR calc non Af Amer: 57 mL/min/{1.73_m2} — ABNORMAL LOW (ref >59)
Globulin, Total: 3.1 g/dL (ref 1.5–4.5)
Glucose: 106 mg/dL — ABNORMAL HIGH (ref 65–99)
Potassium: 3.3 mmol/L — ABNORMAL LOW (ref 3.5–5.2)
Sodium: 141 mmol/L (ref 134–144)
Total Protein: 7.6 g/dL (ref 6.0–8.5)

## 2019-03-29 ENCOUNTER — Other Ambulatory Visit: Payer: Self-pay | Admitting: Primary Care

## 2019-03-29 MED ORDER — POTASSIUM CHLORIDE CRYS ER 20 MEQ PO TBCR: 20.0000 meq | EXTENDED_RELEASE_TABLET | Freq: Every day | ORAL | 3 refills | Status: DC

## 2019-03-31 ENCOUNTER — Encounter: Payer: Self-pay | Admitting: *Deleted

## 2019-03-31 NOTE — Congregational Nurse Program (Signed)
COVID 19 HOTEL SCREEN Patient did not need any further assistance. 

## 2019-04-05 ENCOUNTER — Ambulatory Visit (HOSPITAL_COMMUNITY)
Admission: EM | Admit: 2019-04-05 | Discharge: 2019-04-05 | Disposition: A | Discharge status: Home / Self Care | Payer: Medicare Other | Attending: Family Medicine | Admitting: Family Medicine

## 2019-04-05 ENCOUNTER — Encounter (HOSPITAL_COMMUNITY): Payer: Self-pay | Admitting: Emergency Medicine

## 2019-04-05 DIAGNOSIS — M25512 Pain in left shoulder: Secondary | ICD-10-CM | POA: Diagnosis not present

## 2019-04-05 DIAGNOSIS — Z760 Encounter for issue of repeat prescription: Secondary | ICD-10-CM

## 2019-04-05 MED ORDER — ALBUTEROL SULFATE HFA 108 (90 BASE) MCG/ACT IN AERS
2.0000 | INHALATION_SPRAY | Freq: Once | RESPIRATORY_TRACT | Status: AC
  Administered 2019-04-05: 2 via RESPIRATORY_TRACT

## 2019-04-05 MED ORDER — ALBUTEROL SULFATE HFA 108 (90 BASE) MCG/ACT IN AERS
INHALATION_SPRAY | RESPIRATORY_TRACT | Status: AC
  Filled 2019-04-05: qty 6.7

## 2019-04-05 MED ORDER — NAPROXEN 375 MG PO TABS: 375.0000 mg | ORAL_TABLET | Freq: Two times a day (BID) | ORAL | 0 refills | Status: DC

## 2019-04-05 NOTE — ED Triage Notes (Signed)
Pt c/o L arm pain for several days, states its her arthritis. C/o L elbow pain. States warm showers help

## 2019-04-05 NOTE — Discharge Instructions (Addendum)
Will hold off on shoulder x-rays today Shoulder pain most likely related to arthritis without known injury Continue conservative management of rest, ice, heat and gentle stretches Offered steroid.  Declines at this time Patient would like to trial a short course of antiinflammatories Take naproxen as needed for pain relief (may cause abdominal discomfort, ulcers, and GI bleeds avoid taking with other NSAIDs) Follow up with PCP or orthopedist if symptoms persist Return or go to the ER if you have any new or worsening symptoms (fever, chills, redness, swelling, chest pain, shortness of breath, worsening pain despite treatment, etc...)   Albuterol inhaler given in office.  Use as needed for shortness of breath and/or wheezing

## 2019-04-05 NOTE — ED Notes (Signed)
Patient verbalizes understanding of discharge instructions. Opportunity for questioning and answers were provided. Patient discharged from [facility] by RN.  

## 2019-04-05 NOTE — ED Provider Notes (Signed)
[facility]   [id number] 04/05/19 Arrival Time: 1124  WU:[id number]:JOINT PAIN  SUBJECTIVE: History from: patient. Stacey Harper is a 73 y.o. female hx of acid reflux, arthritis, high cholesterol, and HTN, complains of left shoulder pain that began a week ago.  Denies a precipitating event or specific injury.  Localizes the pain to the left shoulder.  Describes the pain as intermittent and 5/10.  Has tried OTC medications with minimal relief.  Symptoms are made worse with shoulder ROM.  Denies similar symptoms in the past.  Denies fever, chills, chest pain, SOB, erythema, ecchymosis, effusion, weakness, numbness and tingling.     Requests inhaler refill.     ROS: As per HPI.  Past Medical History:  Diagnosis Date  . Acid reflux   . Arthritis   . High cholesterol   . Hypertension    Past Surgical History:  Procedure Laterality Date  . No prior surgery     No Known Allergies No current facility-administered medications on file prior to encounter.    Current Outpatient Medications on File Prior to Encounter  Medication Sig Dispense Refill  . chlorthalidone (HYGROTON) 25 MG tablet Take 1 tablet (25 mg total) by mouth daily. 90 tablet 1  . atorvastatin (LIPITOR) 40 MG tablet Take 1 tablet (40 mg total) by mouth daily. 90 tablet 3  . potassium chloride SA (K-DUR) 20 MEQ tablet Take 1 tablet (20 mEq total) by mouth daily. 30 tablet 3  . terbinafine (LAMISIL) 250 MG tablet Take 1 tablet (250 mg total) by mouth daily. 90 tablet 0  . VENTOLIN HFA 108 (90 Base) MCG/ACT inhaler INHALE 1 TO 2 PUFFS EVERY 6 HOURS AS NEEDED FOR WHEEZING OR SHORTNESS OF BREATH  0   Social History   Socioeconomic History  . Marital status: Married    Spouse name: Not on file  . Number of children: 5  . Years of education: Not on file  . Highest education level: 12th grade  Occupational History  . Not on file  Social Needs  . Financial resource strain: Not hard at all  . Food insecurity:    Worry: Never  true    Inability: Never true  . Transportation needs:    Medical: No    Non-medical: No  Tobacco Use  . Smoking status: Current Every Day Smoker    Packs/day: 0.25    Types: Cigarettes  . Smokeless tobacco: Never Used  . Tobacco comment: 3 cigs/day  Substance and Sexual Activity  . Alcohol use: Yes    Frequency: Never    Comment: Rare  . Drug use: No  . Sexual activity: Yes  Lifestyle  . Physical activity:    Days per week: 7 days    Minutes per session: 20 min  . Stress: Only a little  Relationships  . Social connections:    Talks on phone: More than three times a week    Gets together: More than three times a week    Attends religious service: More than 4 times per year    Active member of club or organization: No    Attends meetings of clubs or organizations: Patient refused    Relationship status: Patient refused  . Intimate partner violence:    Fear of current or ex partner: No    Emotionally abused: No    Physically abused: No    Forced sexual activity: No  Other Topics Concern  . Not on file  Social History Narrative  . Not on  file   Family History  Problem Relation Age of Onset  . Hypertension Mother   . Hypertension Father     OBJECTIVE:  Vitals:   04/05/19 1132  BP: (!) 157/65  Pulse: (!) 57  Resp: 18  Temp: (!) 97.4 F (36.3 C)  SpO2: 96%    General appearance: Alert; in no acute distress.  Head: NCAT Lungs: CTA bilaterally CV: RRR; radial pulse 2+ Musculoskeletal: Left shoulder Inspection: Skin warm, dry, clear and intact without obvious erythema, effusion, or ecchymosis.  Palpation: NTTP ROM: FROM active and passive; discomfort with passive ROM Strength: 5/5 shld abduction, 5/5 shld adduction, 5/5 elbow flexion, 5/5 elbow extension, 5/5 grip strength Skin: warm and dry Neurologic: Ambulates without difficulty; Sensation intact about the upper extremities Psychological: alert and cooperative; normal mood and affect  ASSESSMENT & PLAN:   1. Pain in joint of left shoulder   2. Medication refill     Meds ordered this encounter  Medications  . naproxen (NAPROSYN) 375 MG tablet    Sig: Take 1 tablet (375 mg total) by mouth 2 (two) times daily.    Dispense:  20 tablet    Refill:  0    Order Specific Question:   Supervising Provider    Answer:   Eustace Moore [[id number]]  . albuterol (VENTOLIN HFA) 108 (90 Base) MCG/ACT inhaler 2 puff   Will hold off on shoulder x-rays today Shoulder pain most likely related to arthritis without known injury Continue conservative management of rest, ice, heat and gentle stretches Offered steroid.  Declines at this time Patient would like to trial a short course of antiinflammatories Take naproxen as needed for pain relief (may cause abdominal discomfort, ulcers, and GI bleeds avoid taking with other NSAIDs) Follow up with PCP or orthopedist if symptoms persist Return or go to the ER if you have any new or worsening symptoms (fever, chills, redness, swelling, chest pain, shortness of breath, worsening pain despite treatment, etc...)   Albuterol inhaler given in office.  Use as needed for shortness of breath and/or wheezing  Reviewed expectations re: course of current medical issues. Questions answered. Outlined signs and symptoms indicating need for more acute intervention. Patient verbalized understanding. After Visit Summary given.    Rennis Harding, PA-C 04/05/19 1210

## 2019-04-07 NOTE — Progress Notes (Signed)
COVID Hotel Screening performed. Temperature, PHQ-9, and need for medical care and medications assessed. No additional needs assessed at this time.  Ozelle Brubacher RN MSN 

## 2019-04-13 NOTE — Progress Notes (Signed)
Closing encounter per request. 

## 2019-04-14 NOTE — Progress Notes (Signed)
COVID Hotel Screening performed. Temperature, PHQ-9, and need for medical care and medications assessed. No additional needs assessed at this time.  Bianney Rockwood RN MSN 

## 2019-04-22 NOTE — Progress Notes (Signed)
COVID Hotel Screening performed. Temperature, PHQ-9, and need for medical care and medications assessed. No additional needs assessed at this time.  Aashvi Rezabek RN MSN 

## 2019-05-31 ENCOUNTER — Observation Stay (HOSPITAL_COMMUNITY)
Admission: EM | Admit: 2019-05-31 | Discharge: 2019-06-01 | Disposition: A | Discharge status: Home / Self Care | Payer: Medicare Other | Attending: Student in an Organized Health Care Education/Training Program | Admitting: Student in an Organized Health Care Education/Training Program

## 2019-05-31 ENCOUNTER — Emergency Department (HOSPITAL_COMMUNITY): Payer: Medicare Other

## 2019-05-31 ENCOUNTER — Other Ambulatory Visit: Payer: Self-pay

## 2019-05-31 DIAGNOSIS — Z1159 Encounter for screening for other viral diseases: Secondary | ICD-10-CM | POA: Diagnosis not present

## 2019-05-31 DIAGNOSIS — F17210 Nicotine dependence, cigarettes, uncomplicated: Secondary | ICD-10-CM | POA: Diagnosis not present

## 2019-05-31 DIAGNOSIS — Z79899 Other long term (current) drug therapy: Secondary | ICD-10-CM | POA: Diagnosis not present

## 2019-05-31 DIAGNOSIS — M25512 Pain in left shoulder: Secondary | ICD-10-CM | POA: Diagnosis not present

## 2019-05-31 DIAGNOSIS — M542 Cervicalgia: Secondary | ICD-10-CM

## 2019-05-31 DIAGNOSIS — M79602 Pain in left arm: Secondary | ICD-10-CM

## 2019-05-31 DIAGNOSIS — I10 Essential (primary) hypertension: Secondary | ICD-10-CM | POA: Diagnosis not present

## 2019-05-31 DIAGNOSIS — E876 Hypokalemia: Secondary | ICD-10-CM | POA: Diagnosis present

## 2019-05-31 DIAGNOSIS — R0789 Other chest pain: Secondary | ICD-10-CM | POA: Diagnosis not present

## 2019-05-31 DIAGNOSIS — Z791 Long term (current) use of non-steroidal anti-inflammatories (NSAID): Secondary | ICD-10-CM | POA: Insufficient documentation

## 2019-05-31 DIAGNOSIS — M1990 Unspecified osteoarthritis, unspecified site: Secondary | ICD-10-CM | POA: Insufficient documentation

## 2019-05-31 DIAGNOSIS — R079 Chest pain, unspecified: Secondary | ICD-10-CM | POA: Diagnosis present

## 2019-05-31 DIAGNOSIS — E785 Hyperlipidemia, unspecified: Secondary | ICD-10-CM | POA: Diagnosis not present

## 2019-05-31 DIAGNOSIS — F17200 Nicotine dependence, unspecified, uncomplicated: Secondary | ICD-10-CM | POA: Diagnosis present

## 2019-05-31 HISTORY — DX: Unspecified asthma, uncomplicated: J45.909

## 2019-05-31 LAB — BASIC METABOLIC PANEL
Anion gap: 13 (ref 5–15)
Anion gap: 7 (ref 5–15)
BUN: 20 mg/dL (ref 8–23)
BUN: 20 mg/dL (ref 8–23)
CO2: 27 mmol/L (ref 22–32)
CO2: 27 mmol/L (ref 22–32)
Calcium: 9.2 mg/dL (ref 8.9–10.3)
Calcium: 8.9 mg/dL (ref 8.9–10.3)
Chloride: 98 mmol/L (ref 98–111)
Chloride: 105 mmol/L (ref 98–111)
Creatinine, Ser: 1.08 mg/dL — ABNORMAL HIGH (ref 0.44–1.00)
Creatinine, Ser: 1.36 mg/dL — ABNORMAL HIGH (ref 0.44–1.00)
GFR calc Af Amer: >60 mL/min (ref >60)
GFR calc Af Amer: 46 mL/min — ABNORMAL LOW (ref >60)
GFR calc non Af Amer: 52 mL/min — ABNORMAL LOW (ref >60)
GFR calc non Af Amer: 39 mL/min — ABNORMAL LOW (ref >60)
Glucose, Bld: 108 mg/dL — ABNORMAL HIGH (ref 70–99)
Glucose, Bld: 131 mg/dL — ABNORMAL HIGH (ref 70–99)
Potassium: 2.7 mmol/L — CL (ref 3.5–5.1)
Potassium: 3.8 mmol/L (ref 3.5–5.1)
Sodium: 138 mmol/L (ref 135–145)
Sodium: 139 mmol/L (ref 135–145)

## 2019-05-31 LAB — TROPONIN I
Troponin I: <0.03 ng/mL (ref <0.03)
Troponin I: <0.03 ng/mL (ref <0.03)
Troponin I: <0.03 ng/mL (ref <0.03)

## 2019-05-31 LAB — CBC WITH DIFFERENTIAL/PLATELET
Abs Immature Granulocytes: 0.03 10*3/uL (ref 0.00–0.07)
Basophils Absolute: 0 10*3/uL (ref 0.0–0.1)
Basophils Relative: 1 %
Eosinophils Absolute: 0.3 10*3/uL (ref 0.0–0.5)
Eosinophils Relative: 5 %
HCT: 38.1 % (ref 36.0–46.0)
Hemoglobin: 12.8 g/dL (ref 12.0–15.0)
Immature Granulocytes: 1 %
Lymphocytes Relative: 39 %
Lymphs Abs: 2.4 10*3/uL (ref 0.7–4.0)
MCH: 32.2 pg (ref 26.0–34.0)
MCHC: 33.6 g/dL (ref 30.0–36.0)
MCV: 95.7 fL (ref 80.0–100.0)
Monocytes Absolute: 0.4 10*3/uL (ref 0.1–1.0)
Monocytes Relative: 7 %
Neutro Abs: 3.1 10*3/uL (ref 1.7–7.7)
Neutrophils Relative %: 47 %
Platelets: 254 10*3/uL (ref 150–400)
RBC: 3.98 MIL/uL (ref 3.87–5.11)
RDW: 12.5 % (ref 11.5–15.5)
WBC: 6.3 10*3/uL (ref 4.0–10.5)
nRBC: 0 % (ref 0.0–0.2)

## 2019-05-31 LAB — MAGNESIUM: Magnesium: 1.9 mg/dL (ref 1.7–2.4)

## 2019-05-31 LAB — PHOSPHORUS: Phosphorus: 2.6 mg/dL (ref 2.5–4.6)

## 2019-05-31 MED ORDER — ACETAMINOPHEN 325 MG PO TABS: 650.0000 mg | ORAL_TABLET | Freq: Four times a day (QID) | ORAL | Status: DC | PRN

## 2019-05-31 MED ORDER — ATORVASTATIN CALCIUM 40 MG PO TABS
40.0000 mg | ORAL_TABLET | Freq: Every day | ORAL | Status: DC
  Administered 2019-05-31 – 2019-06-01 (×2): 40 mg via ORAL
  Filled 2019-05-31 (×2): qty 1

## 2019-05-31 MED ORDER — POTASSIUM CHLORIDE CRYS ER 20 MEQ PO TBCR
40.0000 meq | EXTENDED_RELEASE_TABLET | Freq: Two times a day (BID) | ORAL | Status: DC
  Administered 2019-05-31 – 2019-06-01 (×3): 40 meq via ORAL
  Filled 2019-05-31 (×3): qty 2

## 2019-05-31 MED ORDER — FOLIC ACID 1 MG PO TABS
1.0000 mg | ORAL_TABLET | Freq: Every day | ORAL | Status: DC
  Administered 2019-05-31 – 2019-06-01 (×2): 1 mg via ORAL
  Filled 2019-05-31 (×2): qty 1

## 2019-05-31 MED ORDER — THIAMINE HCL 100 MG/ML IJ SOLN
100.0000 mg | Freq: Every day | INTRAMUSCULAR | Status: DC
  Administered 2019-05-31: 14:00:00 100 mg via INTRAVENOUS
  Filled 2019-05-31: qty 2

## 2019-05-31 MED ORDER — ENOXAPARIN SODIUM 40 MG/0.4ML ~~LOC~~ SOLN
40.0000 mg | SUBCUTANEOUS | Status: DC
  Administered 2019-05-31: 40 mg via SUBCUTANEOUS
  Filled 2019-05-31: qty 0.4

## 2019-05-31 MED ORDER — ADULT MULTIVITAMIN W/MINERALS CH
1.0000 | ORAL_TABLET | Freq: Every day | ORAL | Status: DC
  Administered 2019-05-31 – 2019-06-01 (×2): 1 via ORAL
  Filled 2019-05-31 (×3): qty 1

## 2019-05-31 MED ORDER — POTASSIUM CHLORIDE 10 MEQ/100ML IV SOLN
10.0000 meq | INTRAVENOUS | Status: AC
  Administered 2019-05-31 (×2): 10 meq via INTRAVENOUS
  Filled 2019-05-31 (×2): qty 100

## 2019-05-31 MED ORDER — POTASSIUM CHLORIDE CRYS ER 20 MEQ PO TBCR
40.0000 meq | EXTENDED_RELEASE_TABLET | Freq: Once | ORAL | Status: AC
  Administered 2019-05-31: 11:00:00 40 meq via ORAL
  Filled 2019-05-31: qty 2

## 2019-05-31 MED ORDER — ASPIRIN 81 MG PO CHEW
324.0000 mg | CHEWABLE_TABLET | Freq: Once | ORAL | Status: AC
  Administered 2019-05-31: 11:00:00 324 mg via ORAL
  Filled 2019-05-31: qty 4

## 2019-05-31 MED ORDER — ALBUTEROL SULFATE HFA 108 (90 BASE) MCG/ACT IN AERS
1.0000 | INHALATION_SPRAY | Freq: Four times a day (QID) | RESPIRATORY_TRACT | Status: DC | PRN
  Filled 2019-05-31: qty 6.7

## 2019-05-31 MED ORDER — KETOROLAC TROMETHAMINE 15 MG/ML IJ SOLN
15.0000 mg | Freq: Once | INTRAMUSCULAR | Status: AC
  Administered 2019-05-31: 15 mg via INTRAVENOUS
  Filled 2019-05-31: qty 1

## 2019-05-31 NOTE — Progress Notes (Signed)
Patient arrived to room.  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  Bed placed in low position and call light within reach / brakes on on bed.  Complaining of "severe cramping that starts in left neck area and radiates to posterior neck and base of skull.  Hot pack applied with some relief.  MD notified as patient states she has not been able to receive relief from pain "for some time".  See NO.

## 2019-05-31 NOTE — ED Notes (Signed)
ED TO INPATIENT HANDOFF REPORT  ED Nurse Name and Phone #: Judson Roch [phone]  S Name/Age/Gender Stacey Harper 73 y.o. female Room/Bed: 013C/013C  Code Status   Code Status: Full Code  Home/SNF/Other Home Patient oriented to: self, place, time and situation Is this baseline? Yes   Triage Complete: Triage complete  Chief Complaint neck pain  Triage Note Pt here for evaluation of L sided neck pain onset yesterday morning when she woke up. Describes pain as "that someone is holding onto my neck." Pt sts it's hard to take a deep breath bc of the pain.    Allergies No Known Allergies  Level of Care/Admitting Diagnosis ED Disposition    ED Disposition Condition Comment   Admit  Hospital Area: [facility] [[id number]]  Level of Care: Telemetry Cardiac [103]  Covid Evaluation: Screening Protocol (No Symptoms)  Diagnosis: Chest pain at rest [774128]  Admitting Physician: Axel Filler [[id number]]  Attending Physician: Axel Filler [phone]  PT Class (Do Not Modify): Observation [104]  PT Acc Code (Do Not Modify): Observation [10022]       B Medical/Surgery History Past Medical History:  Diagnosis Date  . Acid reflux   . Arthritis   . High cholesterol   . Hypertension    Past Surgical History:  Procedure Laterality Date  . No prior surgery       A IV Location/Drains/Wounds Patient Lines/Drains/Airways Status   Active Line/Drains/Airways    Name:   Placement date:   Placement time:   Site:   Days:   Peripheral IV 05/31/19 Left Antecubital   05/31/19    1047    Antecubital   less than 1          Intake/Output Last 24 hours  Intake/Output Summary (Last 24 hours) at 05/31/2019 1601 Last data filed at 05/31/2019 1317 Gross per 24 hour  Intake 179.88 ml  Output -  Net 179.88 ml    Labs/Imaging Results for orders placed or performed during the hospital encounter of 05/31/19 (from the past 48 hour(s))  Troponin I - Once     Status:  None   Collection Time: 05/31/19  9:26 AM  Result Value Ref Range   Troponin I <0.03 <0.03 ng/mL    Comment: Performed at [facility] [address] [place], [place] [address]  Basic metabolic panel     Status: Abnormal   Collection Time: 05/31/19  9:26 AM  Result Value Ref Range   Sodium 138 135 - 145 mmol/L   Potassium 2.7 (LL) 3.5 - 5.1 mmol/L    Comment: CRITICAL RESULT CALLED TO, READ BACK BY AND VERIFIED WITH: SCOTT [facility] RN AT 1018 05/31/2019 BY WOOLLENK    Chloride 98 98 - 111 mmol/L   CO2 27 22 - 32 mmol/L   Glucose, Bld 108 (H) 70 - 99 mg/dL   BUN 20 8 - 23 mg/dL   Creatinine, Ser 1.08 (H) 0.44 - 1.00 mg/dL   Calcium 9.2 8.9 - 10.3 mg/dL   GFR calc non Af Amer 52 (L) >60 mL/min   GFR calc Af Amer >60 >60 mL/min   Anion gap 13 5 - 15    Comment: Performed at [facility], [place] [address], [place], [place] [address]  CBC with Differential     Status: None   Collection Time: 05/31/19  9:26 AM  Result Value Ref Range   WBC 6.3 4.0 - 10.5 K/uL   RBC 3.98 3.87 - 5.11  MIL/uL   Hemoglobin 12.8 12.0 - 15.0 g/dL   HCT 16.138.1 09.636.0 - 04.546.0 %   MCV 95.7 80.0 - 100.0 fL   MCH 32.2 26.0 - 34.0 pg   MCHC 33.6 30.0 - 36.0 g/dL   RDW 40.912.5 81.111.5 - 91.415.5 %   Platelets 254 150 - 400 K/uL   nRBC 0.0 0.0 - 0.2 %   Neutrophils Relative % 47 %   Neutro Abs 3.1 1.7 - 7.7 K/uL   Lymphocytes Relative 39 %   Lymphs Abs 2.4 0.7 - 4.0 K/uL   Monocytes Relative 7 %   Monocytes Absolute 0.4 0.1 - 1.0 K/uL   Eosinophils Relative 5 %   Eosinophils Absolute 0.3 0.0 - 0.5 K/uL   Basophils Relative 1 %   Basophils Absolute 0.0 0.0 - 0.1 K/uL   Immature Granulocytes 1 %   Abs Immature Granulocytes 0.03 0.00 - 0.07 K/uL    Comment: Performed at [facility], [address] [place], [place] [address]  Magnesium     Status: None   Collection Time: 05/31/19  1:57 PM  Result Value Ref Range   Magnesium 1.9 1.7 - 2.4 mg/dL    Comment: Performed at [facility], [address] [place], [place] [address]  Phosphorus     Status: None   Collection Time: 05/31/19  1:57 PM  Result Value Ref Range   Phosphorus 2.6 2.5 - 4.6 mg/dL    Comment: Performed at [facility], [address] [place], [place] [address]  Troponin I -     Status: None   Collection Time: 05/31/19  1:57 PM  Result Value Ref Range   Troponin I <0.03 <0.03 ng/mL    Comment: Performed at [facility], [address] [place], [place] [address]   Dg Chest 2 View  Result Date: 05/31/2019 CLINICAL DATA:  Neck and arm pain EXAM: CHEST - 2 VIEW COMPARISON:  08/16/2018 chest radiograph. FINDINGS: Stable cardiomediastinal silhouette with normal heart size. No pneumothorax. No pleural effusion. Lungs appear clear, with no acute consolidative airspace disease and no pulmonary edema. IMPRESSION: No active cardiopulmonary disease. Electronically Signed   By: Delbert PhenixJason A Poff M.D.   On: 05/31/2019 09:38    Pending Labs Unresulted Labs (From admission, onward)    Start     Ordered   06/01/19 0500  Comprehensive metabolic panel  Tomorrow morning,   R     05/31/19 1307   05/31/19 2100  Basic metabolic panel  Once-Timed,   STAT     05/31/19 1303   05/31/19 1309  Troponin I - Now Then Q6H  Now then every 6 hours,   R (with STAT occurrences)     05/31/19 1308   05/31/19 1147  Novel Coronavirus,NAA,(SEND-OUT TO REF LAB - TAT 24-48 hrs); Hosp Order  (Asymptomatic Patients Labs)  Once,   STAT    Question:  Rule Out  Answer:  Yes   05/31/19 1146          Vitals/Pain Today's Vitals   05/31/19 1400 05/31/19 1415 05/31/19 1445 05/31/19 1500  BP: 123/81 (!) 134/56 (!) 157/64 (!) 144/55  Pulse: (!) 43 (!) 45 (!) 44 (!) 47  Resp: 13 11 14 16   Temp:      TempSrc:      SpO2: 100% 100% 100% 98%  Weight:      Height:      PainSc:        Isolation  Precautions Droplet and Contact precautions  Medications Medications  enoxaparin (LOVENOX) injection 40 mg (has no administration in time range)   folic acid (FOLVITE) tablet 1 mg (1 mg Oral Given 05/31/19 1400)  thiamine (B-1) injection 100 mg (100 mg Intravenous Given 05/31/19 1401)  multivitamin with minerals tablet 1 tablet (1 tablet Oral Given 05/31/19 1400)  atorvastatin (LIPITOR) tablet 40 mg (40 mg Oral Given 05/31/19 1400)  albuterol (VENTOLIN HFA) 108 (90 Base) MCG/ACT inhaler 1 puff (has no administration in time range)  potassium chloride SA (K-DUR) CR tablet 40 mEq (40 mEq Oral Given 05/31/19 1400)  aspirin chewable tablet 324 mg (324 mg Oral Given 05/31/19 1042)  potassium chloride 10 mEq in 100 mL IVPB (0 mEq Intravenous Stopped 05/31/19 1317)  potassium chloride SA (K-DUR) CR tablet 40 mEq (40 mEq Oral Given 05/31/19 1042)    Mobility walks Low fall risk   Focused Assessments musculoskeletal   R Recommendations: See Admitting Provider Note  Report given to:   Additional Notes:

## 2019-05-31 NOTE — Discharge Summary (Signed)
Name: Stacey SalisburyReta Harper MRN: [id number] DOB: 1946/01/19 73 y.o. PCP: Stacey LevyGomez, Roger David, PA-C  Date of Admission: 05/31/2019  8:00 AM Date of Discharge: 6/22/20206/22/2020 Attending Physician: Erlinda HongVincent, Duncan, MD  Discharge Diagnosis: 1. Atypical Chest Pain 2. Hypokalemia  Discharge Medications: Allergies as of 06/01/2019   No Known Allergies     Medication List    STOP taking these medications   chlorthalidone 25 MG tablet Commonly known as: HYGROTON   diphenhydrAMINE 25 mg capsule Commonly known as: BENADRYL   naproxen 375 MG tablet Commonly known as: NAPROSYN   naproxen sodium 220 MG tablet Commonly known as: ALEVE   potassium chloride SA 20 MEQ tablet Commonly known as: K-DUR   terbinafine 250 MG tablet Commonly known as: LAMISIL     TAKE these medications   atorvastatin 20 MG tablet Commonly known as: LIPITOR Take 20 mg by mouth daily.   cyclobenzaprine 5 MG tablet Commonly known as: FLEXERIL Take 1 tablet (5 mg total) by mouth at bedtime as needed for muscle spasms.   Systane Balance 0.6 % Soln Generic drug: Propylene Glycol Place 1 drop into both eyes daily as needed (dry eyes).   Ventolin HFA 108 (90 Base) MCG/ACT inhaler Generic drug: albuterol INHALE 1 TO 2 PUFFS EVERY 6 HOURS AS NEEDED FOR WHEEZING OR SHORTNESS OF BREATH       Disposition and follow-up:   [name] [facility] in Stable condition.  At the hospital follow up visit please address:  1. Atypical Chest Pain - Discharged with Flexeril - Ensure she is taking it as prescribed, adjust dose as needed  2. Hypokalemia - Ensure she has stopped taking her chlorthalidone - Check a BMP to ensure K is stable - Consider work-up for hyperaldosteronism if recurrent  2.  Labs / imaging needed at time of follow-up: BMP  3.  Pending labs/ test needing follow-up: N/A  Follow-up Appointments: Follow-up Information    [facility]. Go on 06/09/2019.   Why: @9 :50 Contact information: [address] [place] [id number] [phone]          Hospital Course by problem list: 1. Atypical Chest Pain: [name] is a 73 yo F w/ PMH of HTN, HLD, osteoarthritis who [facility] with left sided neck pain. She was noted to have left neck/shoulder pain with radiation down her left arm. She had an EKG without any ischemic changes and negative troponins. She was admitted for observation and troponins were trended with negative results x3. She was discharged with short course of Flexeril and recommendation to f/u with PCP.  2. Hypokalemia: On admission, she was noted to have incidental low potassium at 2.7. Magnesium was found to be 1.9. She was given both IV and PO potassium. Discharged at K of 4.7 with recommendation to discontinue chlorthalidone and follow up with PCP.  Discharge Vitals:   BP 135/78 (BP Location: Right Arm)   Pulse (!) 48   Temp 97.8 F (36.6 C) (Oral)   Resp 19   Ht 5' 5.5" (1.664 m)   Wt 86.7 kg Comment: Scale a  SpO2 100%   BMI 31.32 kg/m   Pertinent Labs, Studies, and Procedures:  BMP Latest Ref Rng & Units 06/01/2019 05/31/2019 05/31/2019  Glucose 70 - 99 mg/dL 865(H118(H) 846(N131(H) 629(B108(H)  BUN 8 - 23 mg/dL 23 20 20   Creatinine 0.44 - 1.00 mg/dL 2.84(X1.16(H) 3.24(M1.36(H) 0.10(U1.08(H)  BUN/Creat Ratio 12 - 28 - - -  Sodium 135 - 145 mmol/L 139 139 138  Potassium 3.5 - 5.1 mmol/L 4.7 3.8 2.7(LL)  Chloride 98 - 111 mmol/L 106 105 98  CO2 22 - 32 mmol/L 25 27 27   Calcium 8.9 - 10.3 mg/dL 8.9 8.9 9.2   Discharge Instructions: Discharge Instructions    Call MD for:  difficulty breathing, headache or visual disturbances   Complete by: As directed    Call MD for:  persistant dizziness or light-headedness   Complete by: As directed    Call MD for:  persistant nausea and vomiting   Complete by: As directed    Call MD for:  severe uncontrolled pain   Complete by: As directed    Diet -  low sodium heart healthy   Complete by: As directed    Discharge instructions   Complete by: As directed    [name]  You came to [place] with complaint of neck pain. We have determined this was caused by muscle strain. Here are our recommendations for you at discharge:  We have prescribed Flexeril 5mg  - take as needed at bedtime for your neck pain Continue to use heat / ice packs and stretch your neck muscles whenever you can Consider speaking to your family doctor about restarting physical therapy Please stop taking your chlorthalidone 25mg   Thank you for choosing [facility].   Increase activity slowly   Complete by: As directed      Signed: Mosetta Anis, MD 06/01/2019, 4:03 PM   Pager: [phone] Internal Medicine Teaching Service

## 2019-05-31 NOTE — ED Triage Notes (Signed)
Pt here for evaluation of L sided neck pain onset yesterday morning when she woke up. Describes pain as "that someone is holding onto my neck." Pt sts it's hard to take a deep breath bc of the pain.

## 2019-05-31 NOTE — ED Notes (Signed)
Stacey Harper -husband- [phone]
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
## 2019-05-31 NOTE — ED Provider Notes (Signed)
[facility] EMERGENCY DEPARTMENT Provider Note   CSN: [id number] Arrival date & time: 05/31/19  0755    History   Chief Complaint Chief Complaint  Patient presents with  . Neck Pain    HPI Stacey Harper is a 73 y.o. female past medical history of hypertension, hyperlipidemia, hypertension, tobacco use, presenting to the emergency department with complaint of left-sided neck pain that began yesterday morning she was sitting in her car.  She states it initially felt like a cramp, however it feels as though somebody is squeezing her neck with radiation down her left arm.  The pain has been waxing and waning since yesterday.  She endorses associated nausea.  Denies diaphoresis, pain in her chest.  No recent strenuous activity.  No aggravating or alleviating factors.  HPI: A 73 year old patient with a history of hypertension and hypercholesterolemia presents for evaluation of chest pain. Initial onset of pain was more than 6 hours ago. The patient's chest pain is described as heaviness/pressure/tightness and is not worse with exertion. The patient complains of nausea. The patient's chest pain is not middle- or left-sided, is not well-localized, is not sharp and does radiate to the arms/jaw/neck. The patient denies diaphoresis. The patient has smoked in the past 90 days. The patient has no history of stroke, has no history of peripheral artery disease, denies any history of treated diabetes, has no relevant family history of coronary artery disease (first degree relative at less than age 17) and does not have an elevated BMI (>=30).   The history is provided by the patient.    Past Medical History:  Diagnosis Date  . Acid reflux   . Arthritis   . High cholesterol   . Hypertension     Patient Active Problem List   Diagnosis Date Noted  . Chest pain 08/16/2018  . Hypokalemia 08/16/2018  . Tobacco dependence 08/16/2018  . Hypertension, isolated systolic 93/71/6967  .  Widened pulse pressure 01/24/2018  . Bradycardia 01/24/2018    Past Surgical History:  Procedure Laterality Date  . No prior surgery       OB History   No obstetric history on file.      Home Medications    Prior to Admission medications   Medication Sig Start Date End Date Taking? Authorizing Provider  atorvastatin (LIPITOR) 40 MG tablet Take 1 tablet (40 mg total) by mouth daily. 08/22/18   Clent Demark, PA-C  chlorthalidone (HYGROTON) 25 MG tablet Take 1 tablet (25 mg total) by mouth daily. 08/20/18   Clent Demark, PA-C  naproxen (NAPROSYN) 375 MG tablet Take 1 tablet (375 mg total) by mouth 2 (two) times daily. 04/05/19   Wurst, [place], PA-C  potassium chloride SA (K-DUR) 20 MEQ tablet Take 1 tablet (20 mEq total) by mouth daily. 03/29/19   Kerin Perna, NP  terbinafine (LAMISIL) 250 MG tablet Take 1 tablet (250 mg total) by mouth daily. 10/07/18   Trula Slade, DPM  VENTOLIN HFA 108 (90 Base) MCG/ACT inhaler INHALE 1 TO 2 PUFFS EVERY 6 HOURS AS NEEDED FOR WHEEZING OR SHORTNESS OF BREATH 08/20/18   [provider]    Family History Family History  Problem Relation Age of Onset  . Hypertension Mother   . Hypertension Father     Social History Social History   Tobacco Use  . Smoking status: Current Every Day Smoker    Packs/day: 0.25    Types: Cigarettes  . Smokeless tobacco: Never Used  .  Tobacco comment: 3 cigs/day  Substance Use Topics  . Alcohol use: Yes    Frequency: Never    Comment: Rare  . Drug use: No     Allergies   Patient has no known allergies.   Review of Systems Review of Systems  All other systems reviewed and are negative.    Physical Exam Updated Vital Signs BP 114/64   Pulse (!) 45   Temp 97.7 F (36.5 C) (Oral)   Resp 11   Ht 5' 5.5" (1.664 m)   Wt 81.6 kg   SpO2 98%   BMI 29.50 kg/m   Physical Exam Vitals signs and nursing note reviewed.  Constitutional:      General: She is not in acute  distress.    Appearance: She is well-developed.  HENT:     Head: Normocephalic and atraumatic.  Eyes:     Conjunctiva/sclera: Conjunctivae normal.  Neck:     Musculoskeletal: Normal range of motion and neck supple. No muscular tenderness.     Comments: No neck masses or obvious abnormalities.  Normal range of motion Cardiovascular:     Rate and Rhythm: Normal rate and regular rhythm.  Pulmonary:     Effort: Pulmonary effort is normal. No respiratory distress.     Breath sounds: Normal breath sounds.  Chest:     Chest wall: No tenderness.  Abdominal:     General: Bowel sounds are normal.     Palpations: Abdomen is soft.     Tenderness: There is no abdominal tenderness. There is no guarding or rebound.  Musculoskeletal:     Right lower leg: No edema.     Left lower leg: No edema.  Skin:    General: Skin is warm.  Neurological:     Mental Status: She is alert.  Psychiatric:        Behavior: Behavior normal.      ED Treatments / Results  Labs (all labs ordered are listed, but only abnormal results are displayed) Labs Reviewed  BASIC METABOLIC PANEL - Abnormal; Notable for the following components:      Result Value   Potassium 2.7 (*)    Glucose, Bld 108 (*)    Creatinine, Ser 1.08 (*)    GFR calc non Af Amer 52 (*)    All other components within normal limits  NOVEL CORONAVIRUS, NAA (HOSPITAL ORDER, SEND-OUT TO REF LAB)  TROPONIN I  CBC WITH DIFFERENTIAL/PLATELET  MAGNESIUM    EKG EKG Interpretation  Date/Time:  Sunday May 31 2019 08:09:17 EDT Ventricular Rate:  50 PR Interval:    QRS Duration: 149 QT Interval:  449 QTC Calculation: 410 R Axis:   66 Text Interpretation:  Sinus rhythm Nonspecific intraventricular conduction delay Anteroseptal infarct, old Confirmed by Kristine RoyalMessick, Peter [phone]([id number]) on 05/31/2019 8:18:40 AM   Radiology Dg Chest 2 View  Result Date: 05/31/2019 CLINICAL DATA:  Neck and arm pain EXAM: CHEST - 2 VIEW COMPARISON:  08/16/2018 chest  radiograph. FINDINGS: Stable cardiomediastinal silhouette with normal heart size. No pneumothorax. No pleural effusion. Lungs appear clear, with no acute consolidative airspace disease and no pulmonary edema. IMPRESSION: No active cardiopulmonary disease. Electronically Signed   By: Delbert PhenixJason A Poff M.D.   On: 05/31/2019 09:38    Procedures Procedures (including critical care time) CRITICAL CARE Performed by: [place] N Remijio Holleran   Total critical care time: 30 minutes  Critical care time was exclusive of separately billable procedures and treating other patients.  Critical care was necessary to treat or  prevent imminent or life-threatening deterioration.  Critical care was time spent personally by me on the following activities: development of treatment plan with patient and/or surrogate as well as nursing, discussions with consultants, evaluation of patient's response to treatment, examination of patient, obtaining history from patient or surrogate, ordering and performing treatments and interventions, ordering and review of laboratory studies, ordering and review of radiographic studies, pulse oximetry and re-evaluation of patient's condition.  Medications Ordered in ED Medications  potassium chloride 10 mEq in 100 mL IVPB (10 mEq Intravenous New Bag/Given 05/31/19 1217)  aspirin chewable tablet 324 mg (324 mg Oral Given 05/31/19 1042)  potassium chloride SA (K-DUR) CR tablet 40 mEq (40 mEq Oral Given 05/31/19 1042)     Initial Impression / Assessment and Plan / ED Course  I have reviewed the triage vital signs and the nursing notes.  Pertinent labs & imaging results that were available during my care of the patient were reviewed by me and considered in my medical decision making (see chart for details).     HEAR Score: 6  Patient presenting a left-sided neck and arm pain/tightness since yesterday, waxing and waning.  Heart score of 6, no recent stress test found on chart review.  She is  some associated nausea with her symptoms.  Concern for cardiac etiology of symptoms.  Initial troponin is negative, EKG without changes.  Patient does have significant hypokalemia of 2.7 the mild elevation in creatinine 1.08.  Magnesium level pending.  Potassium replaced with IV and p.o.  At this time, will admit for chest pain rule out and hypokalemia.  Patient discussed with Dr. Rodena MedinMessick, who agrees with care plan for admission.  The patient appears reasonably stabilized for admission considering the current resources, flow, and capabilities available in the ED at this time, and I doubt any other [facility] requiring further screening and/or treatment in the ED prior to admission.  Final Clinical Impressions(s) / ED Diagnoses   Final diagnoses:  Neck pain  Hypokalemia  Left arm pain    ED Discharge Orders    None       Daysha Ashmore, [place] N, PA-C 05/31/19 1230    Wynetta FinesMessick, Peter C, MD 06/01/19 [phone]

## 2019-06-01 ENCOUNTER — Encounter (HOSPITAL_COMMUNITY): Payer: Self-pay | Admitting: General Practice

## 2019-06-01 DIAGNOSIS — E785 Hyperlipidemia, unspecified: Secondary | ICD-10-CM

## 2019-06-01 DIAGNOSIS — M1990 Unspecified osteoarthritis, unspecified site: Secondary | ICD-10-CM

## 2019-06-01 DIAGNOSIS — E876 Hypokalemia: Secondary | ICD-10-CM | POA: Diagnosis not present

## 2019-06-01 DIAGNOSIS — I10 Essential (primary) hypertension: Secondary | ICD-10-CM

## 2019-06-01 DIAGNOSIS — R001 Bradycardia, unspecified: Secondary | ICD-10-CM

## 2019-06-01 DIAGNOSIS — M542 Cervicalgia: Secondary | ICD-10-CM

## 2019-06-01 DIAGNOSIS — Z720 Tobacco use: Secondary | ICD-10-CM

## 2019-06-01 DIAGNOSIS — Z1159 Encounter for screening for other viral diseases: Secondary | ICD-10-CM | POA: Diagnosis not present

## 2019-06-01 DIAGNOSIS — R0789 Other chest pain: Secondary | ICD-10-CM | POA: Diagnosis not present

## 2019-06-01 DIAGNOSIS — Z79899 Other long term (current) drug therapy: Secondary | ICD-10-CM

## 2019-06-01 HISTORY — DX: Hypokalemia: E87.6

## 2019-06-01 LAB — BASIC METABOLIC PANEL
Anion gap: 8 (ref 5–15)
BUN: 23 mg/dL (ref 8–23)
CO2: 25 mmol/L (ref 22–32)
Calcium: 8.9 mg/dL (ref 8.9–10.3)
Chloride: 106 mmol/L (ref 98–111)
Creatinine, Ser: 1.16 mg/dL — ABNORMAL HIGH (ref 0.44–1.00)
GFR calc Af Amer: 55 mL/min — ABNORMAL LOW (ref >60)
GFR calc non Af Amer: 48 mL/min — ABNORMAL LOW (ref >60)
Glucose, Bld: 118 mg/dL — ABNORMAL HIGH (ref 70–99)
Potassium: 4.7 mmol/L (ref 3.5–5.1)
Sodium: 139 mmol/L (ref 135–145)

## 2019-06-01 MED ORDER — VITAMIN B-1 100 MG PO TABS
100.0000 mg | ORAL_TABLET | Freq: Every day | ORAL | Status: DC
  Administered 2019-06-01: 10:00:00 100 mg via ORAL
  Filled 2019-06-01: qty 1

## 2019-06-01 MED ORDER — SODIUM CHLORIDE 0.9 % IV BOLUS
1000.0000 mL | Freq: Once | INTRAVENOUS | Status: AC
  Administered 2019-06-01: 09:00:00 1000 mL via INTRAVENOUS

## 2019-06-01 MED ORDER — CYCLOBENZAPRINE HCL 5 MG PO TABS: 5.0000 mg | ORAL_TABLET | Freq: Every evening | ORAL | 0 refills | Status: DC | PRN

## 2019-06-01 MED ORDER — LIDOCAINE 5 % EX PTCH
1.0000 | MEDICATED_PATCH | CUTANEOUS | Status: DC
  Administered 2019-06-01: 1 via TRANSDERMAL
  Filled 2019-06-01: qty 1

## 2019-06-01 MED ORDER — BACLOFEN 5 MG HALF TABLET
10.0000 mg | ORAL_TABLET | Freq: Two times a day (BID) | ORAL | Status: DC
  Administered 2019-06-01: 10:00:00 10 mg via ORAL
  Filled 2019-06-01: qty 2
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
## 2019-06-01 NOTE — Plan of Care (Signed)
Patient ambulating in room by himself

## 2019-06-01 NOTE — Plan of Care (Signed)
  Problem: Pain Managment: Goal: General experience of comfort will improve Outcome: Adequate for Discharge    Problem: Coping: Goal: Level of anxiety will decrease Outcome: Adequate for Discharge

## 2019-06-02 LAB — NOVEL CORONAVIRUS, NAA (HOSP ORDER, SEND-OUT TO REF LAB; TAT 18-24 HRS): SARS-CoV-2, NAA: NOT DETECTED

## 2019-06-09 ENCOUNTER — Inpatient Hospital Stay: Payer: Medicare Other | Admitting: Primary Care

## 2019-06-26 ENCOUNTER — Other Ambulatory Visit (INDEPENDENT_AMBULATORY_CARE_PROVIDER_SITE_OTHER): Payer: Self-pay | Admitting: Primary Care

## 2019-08-07 ENCOUNTER — Other Ambulatory Visit: Payer: Self-pay

## 2019-08-07 ENCOUNTER — Encounter (INDEPENDENT_AMBULATORY_CARE_PROVIDER_SITE_OTHER): Payer: Self-pay | Admitting: Primary Care

## 2019-08-07 ENCOUNTER — Ambulatory Visit (INDEPENDENT_AMBULATORY_CARE_PROVIDER_SITE_OTHER): Payer: Medicare Other | Admitting: Primary Care

## 2019-08-07 DIAGNOSIS — M25562 Pain in left knee: Secondary | ICD-10-CM

## 2019-08-07 DIAGNOSIS — E876 Hypokalemia: Secondary | ICD-10-CM

## 2019-08-07 DIAGNOSIS — M25561 Pain in right knee: Secondary | ICD-10-CM

## 2019-08-07 DIAGNOSIS — R0602 Shortness of breath: Secondary | ICD-10-CM

## 2019-08-07 DIAGNOSIS — Z0100 Encounter for examination of eyes and vision without abnormal findings: Secondary | ICD-10-CM | POA: Diagnosis not present

## 2019-08-07 DIAGNOSIS — F17200 Nicotine dependence, unspecified, uncomplicated: Secondary | ICD-10-CM | POA: Diagnosis not present

## 2019-08-07 DIAGNOSIS — Z09 Encounter for follow-up examination after completed treatment for conditions other than malignant neoplasm: Secondary | ICD-10-CM

## 2019-08-07 MED ORDER — VENTOLIN HFA 108 (90 BASE) MCG/ACT IN AERS: INHALATION_SPRAY | RESPIRATORY_TRACT | 0 refills | Status: AC

## 2019-08-07 MED ORDER — CYCLOBENZAPRINE HCL 5 MG PO TABS: 5.0000 mg | ORAL_TABLET | Freq: Every evening | ORAL | 1 refills | Status: AC | PRN

## 2019-08-07 NOTE — Progress Notes (Signed)
Virtual Visit via Telephone Note  I connected with Norville Haggard on 08/07/19 at  9:30 AM EDT by telephone and verified that I am speaking with the correct person using two identifiers.   I discussed the limitations, risks, security and privacy concerns of performing an evaluation and management service by telephone and the availability of in person appointments. I also discussed with the patient that there may be a patient responsible charge related to this service. The patient expressed understanding and agreed to proceed.   History of Present Illness:  Ms. Rodier is a having a tele visit for muscle spasm in her back requesting medication to help with this. She admits to being short winded with activities and refill on her asthma medication. Hospitalized in June 2020 for hypokalemia explain she needed to be seen for follow up lab work patient agreed Past Medical History:  Diagnosis Date  . Acid reflux   . Arthritis   . Asthma   . High cholesterol   . Hypertension   . Hypokalemia 06/01/2019    Observations/Objective: Review of Systems  Eyes:       Vision changes  Respiratory: Positive for shortness of breath and wheezing.   Musculoskeletal: Positive for back pain.       Muscle spasm  All other systems reviewed and are negative.  Assessment and Plan: Deolinda was seen today for establish care, arthritis and medication refill.  Diagnoses and all orders for this visit:  Hypokalemia  The body needs potassium to control blood pressure and to keep the muscles and nervous system healthy. Here are some healthy foods below that are high in potassium. Also you can get the white label salt of "NO SALT" salt substitute, 1/4 teaspoon of this is equivalent to 19mq potassium.       CMP14+EGFR; Future  Hospital discharge follow-up Discharge instruction stopped taking her chlorthalidone Check a BMP to ensure K is stable Consider work-up for hyperaldosteronism if recurrent Appoint made at  discharge 06/09/2019 -noshow -     Magnesium; Future       CMP  Tobacco dependence Nicotine affect every organ in the body second leading cause of death.  Increased risk for lung cancer and other respiratory diseases recommend cessation.  This will be reminded at each clinical visit.   Arthralgia of both knees Work on losing weight to help reduce joint pain. May alternate with heat and ice application for pain relief. May also alternate with acetaminophen and Ibuprofen as prescribed pain relief. Other alternatives include massage, acupuncture and water aerobics.  You must stay active and avoid a sedentary lifestyle.  Visit for eye and vision exam -     Ambulatory referral to Ophthalmology  Other orders -     cyclobenzaprine (FLEXERIL) 5 MG tablet; Take 1 tablet (5 mg total) by mouth at bedtime as needed for muscle spasms. -     VENTOLIN HFA 108 (90 Base) MCG/ACT inhaler; INHALE 1 TO 2 PUFFS EVERY 6 HOURS AS NEEDED FOR WHEEZING OR SHORTNESS OF BREATH    Follow Up Instructions:    I discussed the assessment and treatment plan with the patient. The patient was provided an opportunity to ask questions and all were answered. The patient agreed with the plan and demonstrated an understanding of the instructions.   The patient was advised to call back or seek an in-person evaluation if the symptoms worsen or if the condition fails to improve as anticipated.  I provided 25  minutes of non-face-to-face time during  this encounter. Including teaching, review of encounters , labs and imaging    Kerin Perna, NP

## 2019-08-07 NOTE — Progress Notes (Signed)
Patient would like Rx for joint pain Pt needs referral to eye doctor

## 2019-09-18 ENCOUNTER — Ambulatory Visit (INDEPENDENT_AMBULATORY_CARE_PROVIDER_SITE_OTHER): Payer: Medicare Other | Admitting: Primary Care

## 2020-06-30 IMAGING — CT CT ANGIO CHEST
2 of 6 series · 19 of 46 positions shown · IV contrast (APPLIED)
Comparison: Chest x-ray from same day.

CLINICAL DATA: Intermittent chronic chest pain. New onset shortness
of breath over the last few days.

EXAM:
CT ANGIOGRAPHY CHEST WITH CONTRAST
TECHNIQUE: Multidetector CT imaging of the chest was performed using the
standard protocol during bolus administration of intravenous
contrast. Multiplanar CT image reconstructions and MIPs were
obtained to evaluate the vascular anatomy.
CONTRAST:  60mL [id number] IOPAMIDOL ([id number]) INJECTION 76%

[Series 7: thins · axial · 0.61mm/px · z∈[+1144,+1396]mm · 16 of 396 slices shown]
[im 18/396  lung]
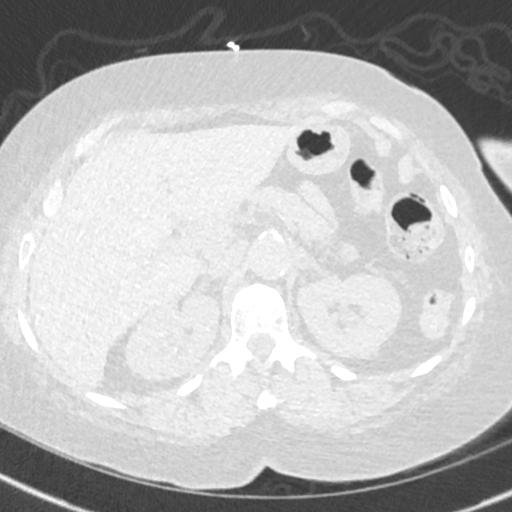
[im 52/396  soft-tissue]
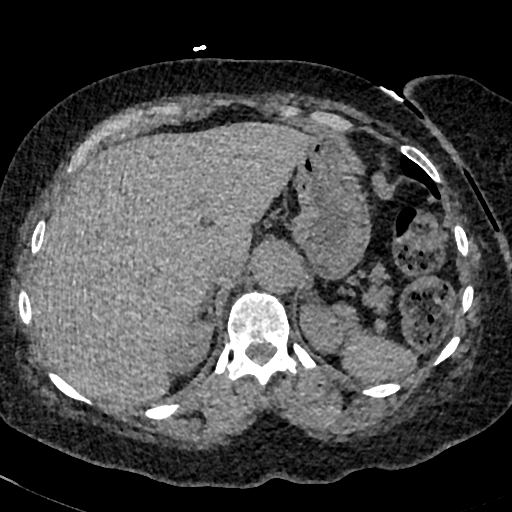
[im 69/396  lung]
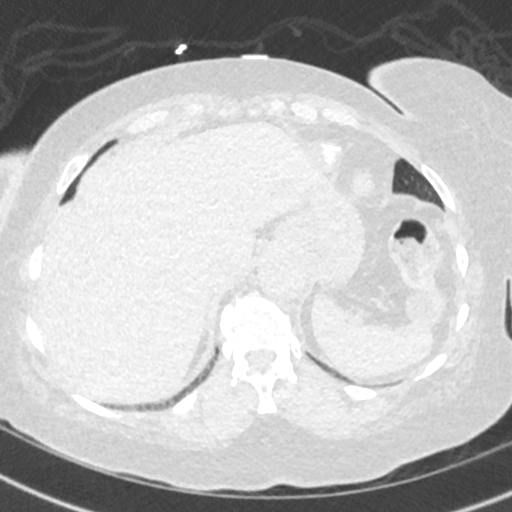
[im 86/396  soft-tissue]
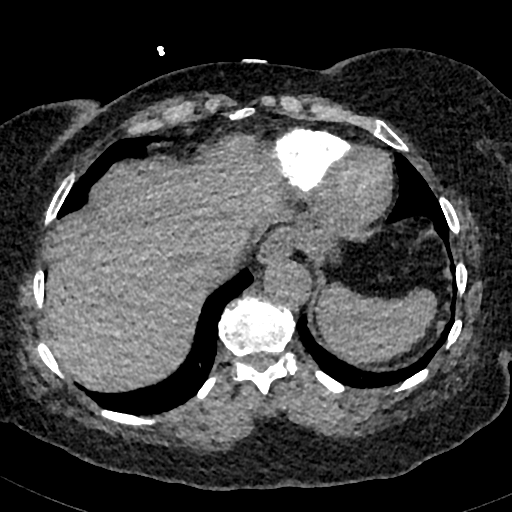
[im 121/396  lung]
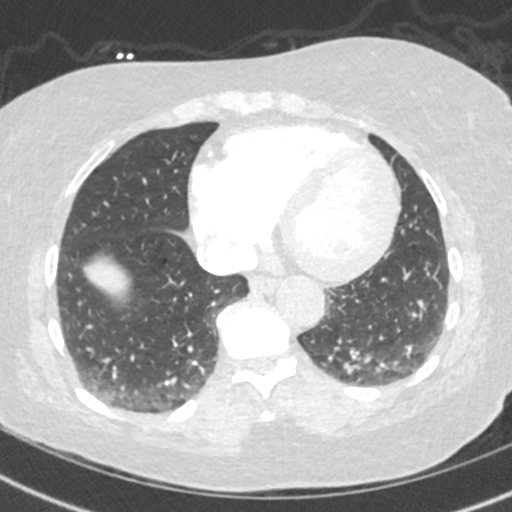
[im 138/396  soft-tissue]
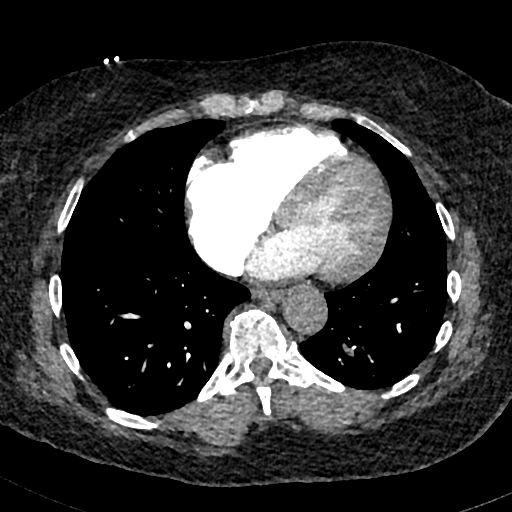
[im 155/396  lung]
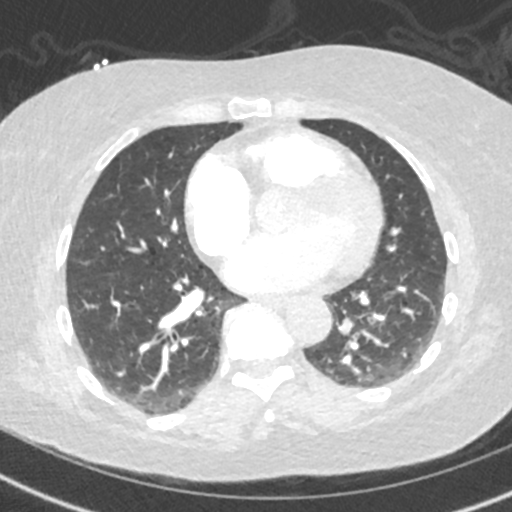
[im 189/396  soft-tissue]
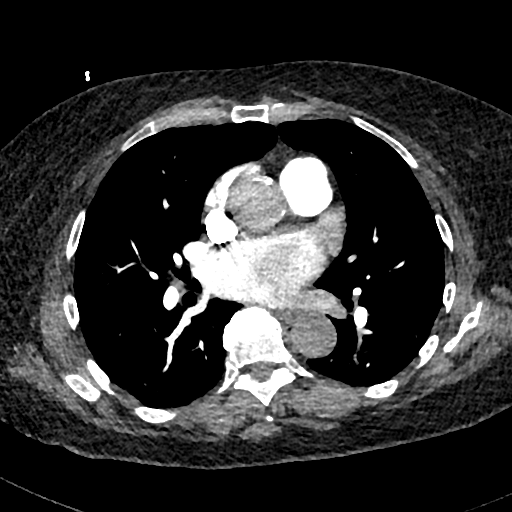
[im 207/396  lung]
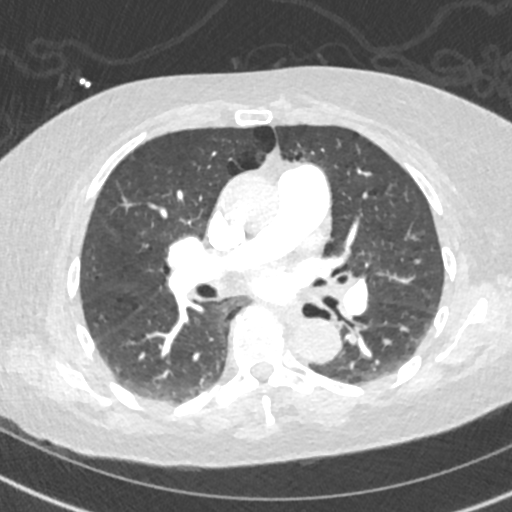
[im 241/396  soft-tissue]
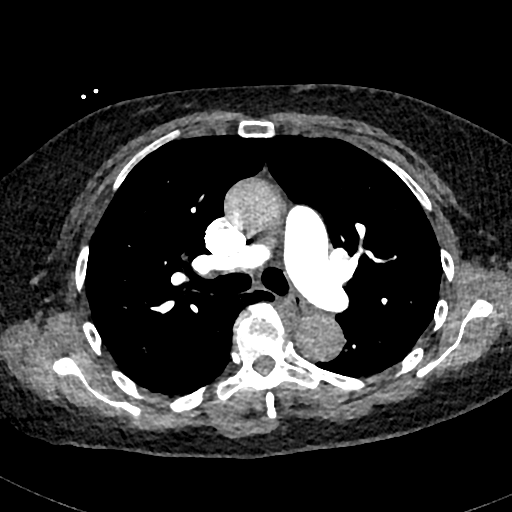
[im 258/396  lung]
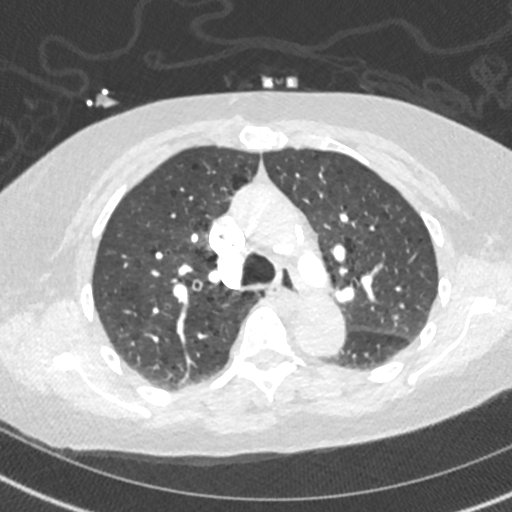
[im 275/396  soft-tissue]
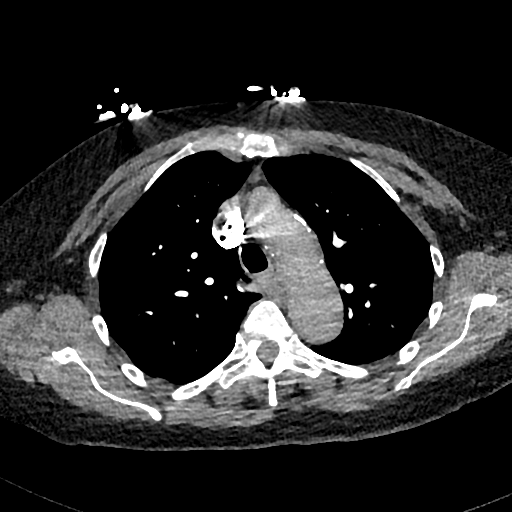
[im 310/396  lung]
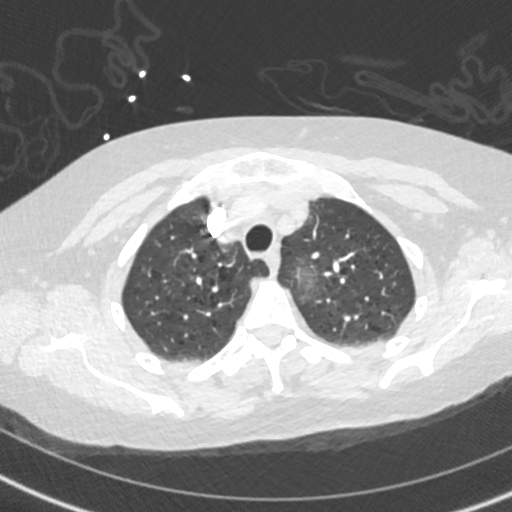
[im 327/396  soft-tissue]
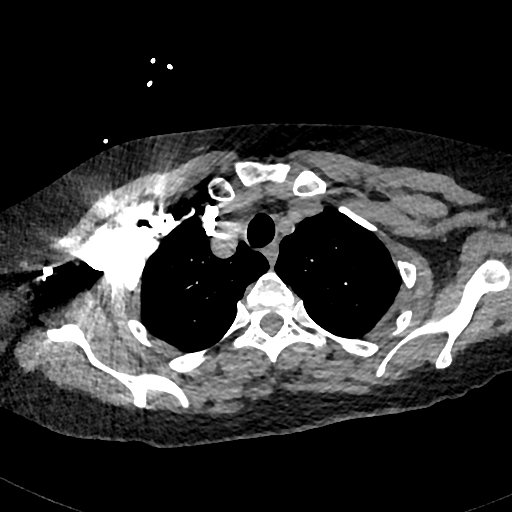
[im 344/396  lung]
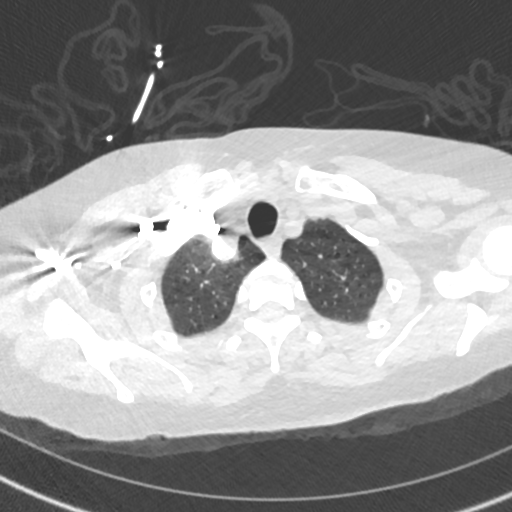
[im 378/396  soft-tissue]
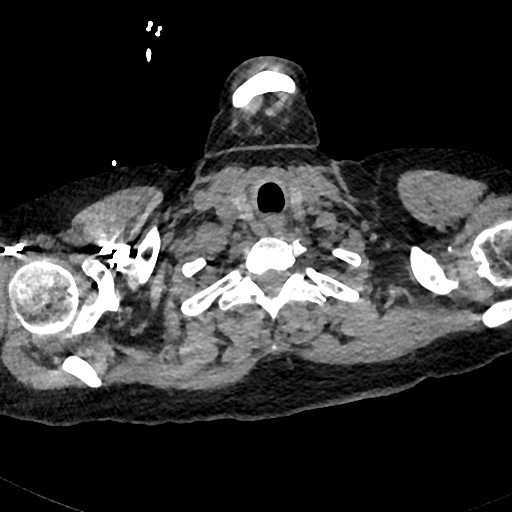

[Series 8: cor · coronal · 0.56mm/px · 3 of 102 slices shown]
[im 26/102  soft-tissue]
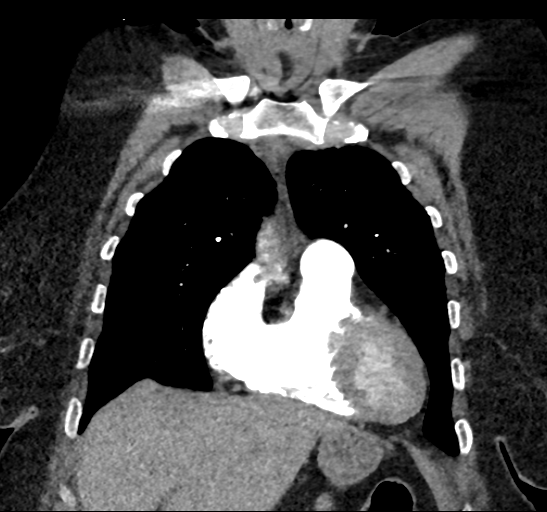
[im 51/102  soft-tissue]
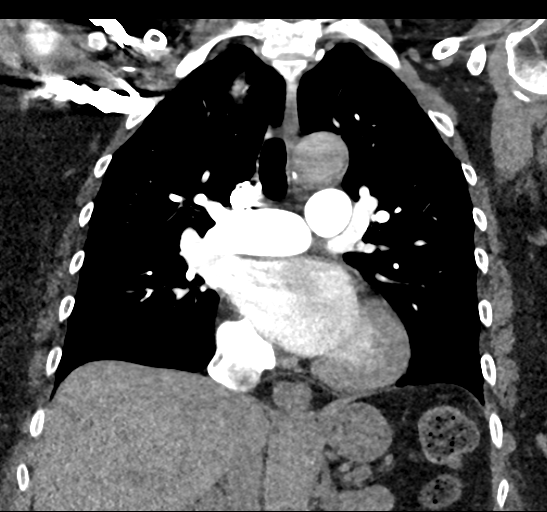
[im 76/102  soft-tissue]
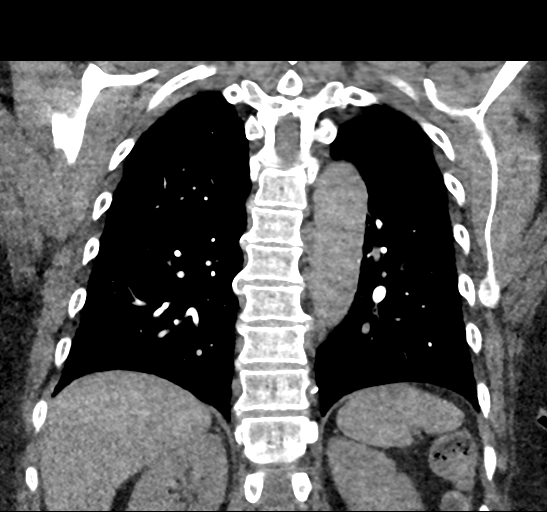

[19 of 46 positions shown; findings below may reference images not displayed]

FINDINGS: Cardiovascular: Excellent opacification of the pulmonary arteries to
the segmental level. No evidence of pulmonary embolism. Heart size
is at the upper limits of normal. No pericardial effusion. Normal
caliber thoracic aorta. Coronary, aortic arch, and branch vessel
atherosclerotic vascular disease.

Mediastinum/Nodes: No enlarged mediastinal, hilar, or axillary lymph
nodes. Thyroid gland, trachea, and esophagus demonstrate no
significant findings.

Lungs/Pleura: Mild diffuse peribronchial thickening. Mild upper lobe
predominant centrilobular and paraseptal emphysema. Mild bilateral
lower lobe dependent subsegmental atelectasis. No focal
consolidation, pleural effusion, or pneumothorax. No suspicious
pulmonary nodule.

Upper Abdomen: No acute abnormality.

Musculoskeletal: No chest wall abnormality. No acute or significant
osseous findings.

Review of the MIP images confirms the above findings.
IMPRESSION: 1.  No evidence of pulmonary embolism.
2. Mild diffuse peribronchial thickening, likely smoking-related. No
pneumonia.
3.  Emphysema (A2FLB-PPF.5).
4.  Aortic atherosclerosis (A2FLB-B9C.C).

## 2021-04-14 IMAGING — CR CHEST - 2 VIEW
2 series · 2 of 2 positions shown · non-contrast
Comparison: 08/16/2018 chest radiograph.

CLINICAL DATA: Neck and arm pain

EXAM:
CHEST - 2 VIEW

[chest lat]
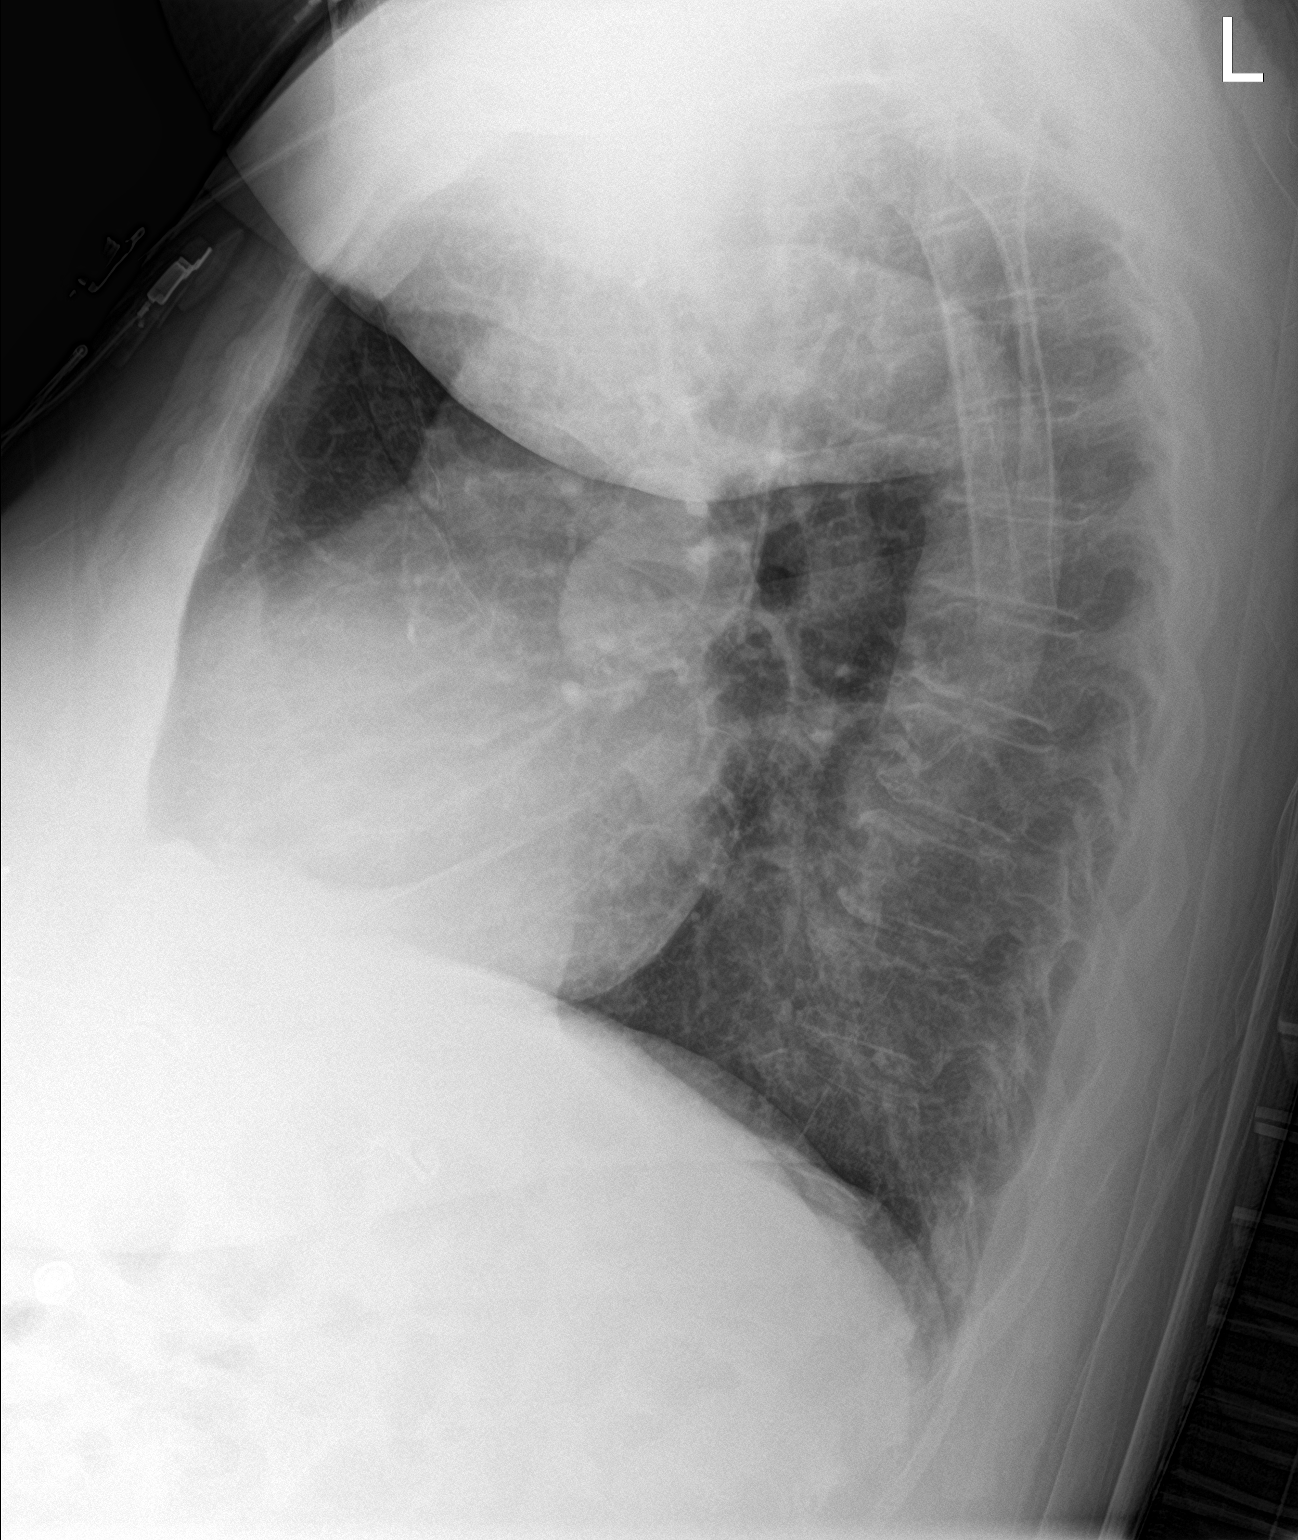

[chest ap]
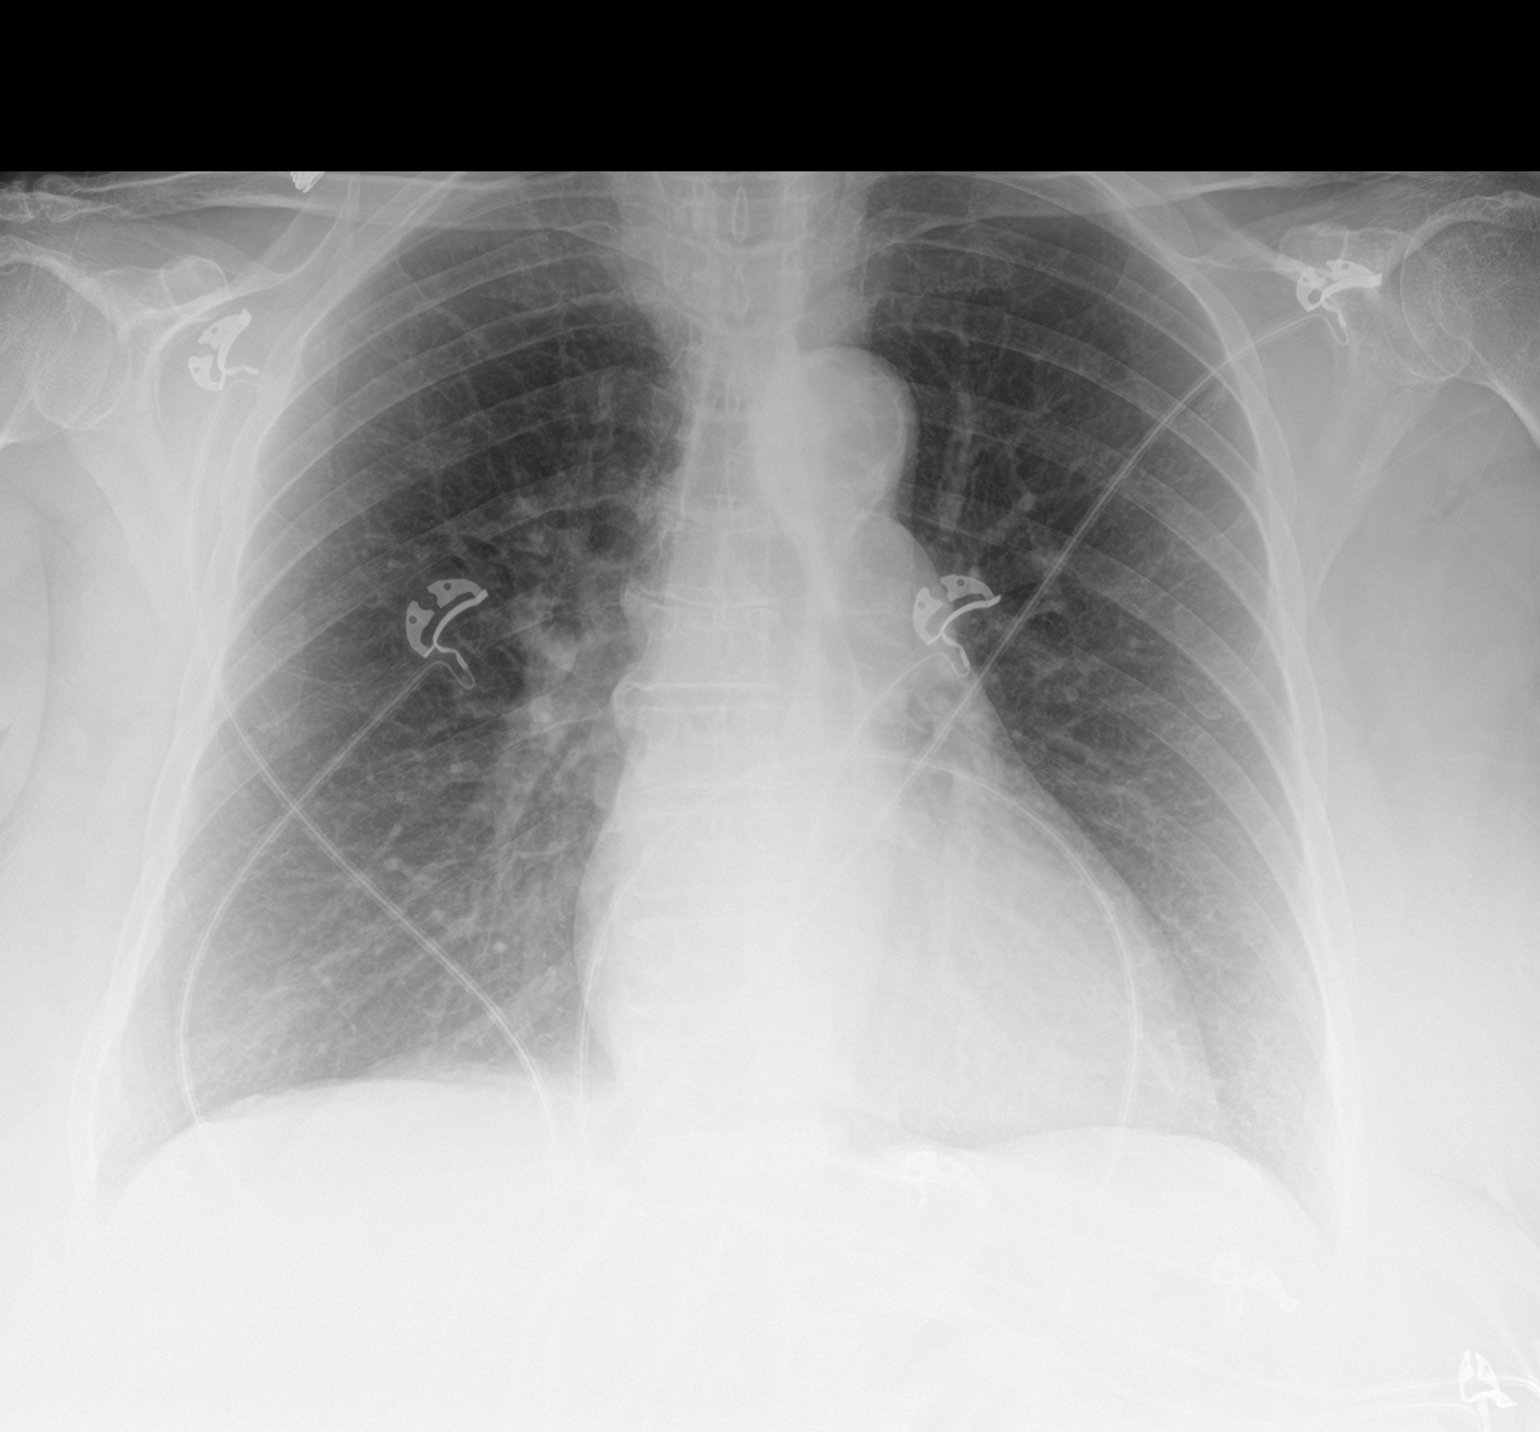

[2 of 2 positions shown; findings below may reference images not displayed]

FINDINGS: Stable cardiomediastinal silhouette with normal heart size. No
pneumothorax. No pleural effusion. Lungs appear clear, with no acute
consolidative airspace disease and no pulmonary edema.
IMPRESSION: No active cardiopulmonary disease.
